# Patient Record
Sex: Female | Born: 1955 | Race: Black or African American | Hispanic: No | Marital: Single | Smoking: Never smoker
Health system: Southern US, Community
[De-identification: ages and names within clinical notes are randomized; demographics above are authoritative.]

## PROBLEM LIST (undated history)

## (undated) DIAGNOSIS — D649 Anemia, unspecified: Secondary | ICD-10-CM

## (undated) DIAGNOSIS — G43909 Migraine, unspecified, not intractable, without status migrainosus: Secondary | ICD-10-CM

## (undated) DIAGNOSIS — T7840XA Allergy, unspecified, initial encounter: Secondary | ICD-10-CM

## (undated) DIAGNOSIS — J45909 Unspecified asthma, uncomplicated: Secondary | ICD-10-CM

## (undated) HISTORY — PX: TONSILLECTOMY: SUR1361

## (undated) HISTORY — DX: Allergy, unspecified, initial encounter: T78.40XA

## (undated) HISTORY — DX: Migraine, unspecified, not intractable, without status migrainosus: G43.909

## (undated) HISTORY — DX: Anemia, unspecified: D64.9

## (undated) HISTORY — DX: Unspecified asthma, uncomplicated: J45.909

---

## 2013-08-13 ENCOUNTER — Encounter (HOSPITAL_COMMUNITY): Payer: Self-pay

## 2013-08-13 ENCOUNTER — Emergency Department (HOSPITAL_COMMUNITY)
Admission: EM | Admit: 2013-08-13 | Discharge: 2013-08-13 | Disposition: A | Discharge status: Home / Self Care | Payer: BC Managed Care – PPO | Source: Home / Self Care | Attending: Emergency Medicine | Admitting: Emergency Medicine

## 2013-08-13 DIAGNOSIS — M5416 Radiculopathy, lumbar region: Secondary | ICD-10-CM

## 2013-08-13 DIAGNOSIS — IMO0002 Reserved for concepts with insufficient information to code with codable children: Secondary | ICD-10-CM

## 2013-08-13 MED ORDER — MELOXICAM 7.5 MG PO TABS: 7.5000 mg | ORAL_TABLET | Freq: Every day | ORAL | Status: DC

## 2013-08-13 NOTE — ED Notes (Signed)
Pain in both legs x past 6 months, right leg as been getting red and blotchy for no apparent reason; no knpwn injury ; NAD

## 2013-08-13 NOTE — ED Provider Notes (Signed)
Medical screening examination/treatment/procedure(s) were performed by a resident physician and as supervising physician I was immediately available for consultation/collaboration.  Leslee Home, M.D.  Reuben Likes, MD 08/13/13 2217

## 2013-08-13 NOTE — ED Provider Notes (Signed)
  CSN: [id number]     Arrival date & time 08/13/13  1612 History     First MD Initiated Contact with Patient 08/13/13 1733     Chief Complaint  Patient presents with  . Leg Pain   (Consider location/radiation/quality/duration/timing/severity/associated sxs/prior Treatment) HPI Patient is a 57 yo F with 1 year history of progressively worsening right hip and leg pain. She states she is unable to sleep at night because of the pain. No known injury. She states nothing has changed that caused her to be evaluated today for her longstanding pain. She endorses some swelling of the leg. She states she is able to ambulate with no difficulty. She has taken Aleve which helps some, but does not take the pain away. She does not have a PCP and has not been evaluated for this pain in the past.   History reviewed. No pertinent past medical history. History reviewed. No pertinent past surgical history. History reviewed. No pertinent family history. History  Substance Use Topics  . Smoking status: Never Smoker   . Smokeless tobacco: Not on file  . Alcohol Use: No   OB History   Grav Para Term Preterm Abortions TAB SAB Ect Mult Living                 Review of Systems  Constitutional: Positive for appetite change. Negative for fever and chills.  Respiratory: Negative for cough and shortness of breath.   Cardiovascular: Positive for leg swelling. Negative for chest pain and palpitations.  Gastrointestinal: Negative for abdominal pain.  Genitourinary: Negative for dysuria.  Musculoskeletal: Positive for myalgias, arthralgias and gait problem. Negative for back pain and joint swelling.  Skin: Negative for rash.  Neurological: Negative for headaches.    Allergies  Review of patient's allergies indicates not on file.  Home Medications   Current Outpatient Rx  Name  Route  Sig  Dispense  Refill  . meloxicam (MOBIC) 7.5 MG tablet   Oral   Take 1 tablet (7.5 mg total) by mouth daily.   30  tablet   0    BP 131/78  Pulse 93  Temp(Src) 98.3 F (36.8 C) (Oral)  Resp 16  SpO2 100% Physical Exam  Constitutional: She appears well-developed and well-nourished. No distress.  HENT:  Head: Normocephalic and atraumatic.  Mouth/Throat: Oropharynx is clear and moist.  Neck: Normal range of motion.  Cardiovascular: Normal rate and regular rhythm.   Pulmonary/Chest: Effort normal and breath sounds normal.  Musculoskeletal:  No spinal tenderness. TTP of right greater trochanter and ?hyperesthesia of right thigh. No knee tenderness. FROM of right hip and knee. No pain with internal/external rotation of the leg. Mild discomfort with strength testing. Negative straight leg raise.   Lymphadenopathy:    She has no cervical adenopathy.    ED Course   Procedures (including critical care time)  Labs Reviewed - No data to display No results found. 1. Lumbar radicular pain    MDM  57 yo F with chronic hip/leg pain without injury or acute exacerbation. No red flags or concerning findings on physical exam.  -No indication for imaging today. -Given Mobic to take daily for inflammation and pain. -[facility] to establish a PCP. She needs a primary doctor -Follow up in 2 weeks if pain has not improved, or sooner if it worsens.  Hilarie Fredrickson, MD 08/13/13 [phone]

## 2013-09-02 ENCOUNTER — Encounter: Payer: Self-pay | Admitting: Family Medicine

## 2013-09-02 ENCOUNTER — Ambulatory Visit (INDEPENDENT_AMBULATORY_CARE_PROVIDER_SITE_OTHER): Payer: BC Managed Care – PPO | Admitting: Family Medicine

## 2013-09-02 VITALS — BP 104/70 | Temp 98.4°F | Ht 61.0 in | Wt 150.0 lb

## 2013-09-02 DIAGNOSIS — J45909 Unspecified asthma, uncomplicated: Secondary | ICD-10-CM

## 2013-09-02 DIAGNOSIS — Z7689 Persons encountering health services in other specified circumstances: Secondary | ICD-10-CM | POA: Insufficient documentation

## 2013-09-02 DIAGNOSIS — Z23 Encounter for immunization: Secondary | ICD-10-CM

## 2013-09-02 DIAGNOSIS — Z7189 Other specified counseling: Secondary | ICD-10-CM

## 2013-09-02 DIAGNOSIS — M549 Dorsalgia, unspecified: Secondary | ICD-10-CM | POA: Insufficient documentation

## 2013-09-02 DIAGNOSIS — J309 Allergic rhinitis, unspecified: Secondary | ICD-10-CM

## 2013-09-02 DIAGNOSIS — J302 Other seasonal allergic rhinitis: Secondary | ICD-10-CM

## 2013-09-02 MED ORDER — CYCLOBENZAPRINE HCL 5 MG PO TABS
5.0000 mg | ORAL_TABLET | Freq: Three times a day (TID) | ORAL | Status: DC | PRN
Start: 2013-09-02 — End: 2024-06-16

## 2013-09-02 NOTE — Progress Notes (Signed)
Chief Complaint  Patient presents with  . Establish Care    HPI:  Danielle Castillo is here to establish care. Has not seen a doctor in a long time and that doctor left. Last PCP and physical: has not had a physical in 10 years.  Has the following chronic problems and concerns today:  1)R leg pain: -started several years ago -comes and goes -has had a flare in last few weeks to a month or two -pain is located, R lower back lateral hip and upper leg and and buttock -worse with lying on it, better with nothing -denies: fevers, malaise, numbness, weakness, bowel or bladder incontinence -seen in urgent car in the past with flares and took meloxicam  2)Asthma/Alllergies: -followed by [place] asthma and allergy  Patient Active Problem List   Diagnosis Date Noted  . Back pain 09/02/2013  . Encounter to establish care 09/02/2013  . Asthma 09/02/2013  . Seasonal allergies 09/02/2013   Health Maintenance: -reports had colonoscopy and was normal in 2009, and had mammogram about 1 month ago and normal -adamantly refuses the flu vaccine, agrees to tdap  ROS: See pertinent positives and negatives per HPI.  Past Medical History  Diagnosis Date  . Allergy   . Asthma   . Migraine   . Anemia     chronic her whole life, had egd and colonoscopy in 2009 normal    Family History  Problem Relation Age of Onset  . Diabetes Mother   . Hypertension Mother   . Hypertension Father   . Stroke Father   . Cancer Sister     History   Social History  . Marital Status: Single    Spouse Name: N/A    Number of Children: N/A  . Years of Education: N/A   Social History Main Topics  . Smoking status: Never Smoker   . Smokeless tobacco: None  . Alcohol Use: No  . Drug Use: No  . Sexual Activity: None   Other Topics Concern  . None   Social History Narrative   Work or School: Armed forces operational officer, medication delivery      Home Situation: lives alone      Spiritual Beliefs:  Baptist      Lifestyle: walk occasionally; diet is good             Current outpatient prescriptions:albuterol (PROVENTIL HFA;VENTOLIN HFA) 108 (90 BASE) MCG/ACT inhaler, Inhale 2 puffs into the lungs every 6 (six) hours as needed for wheezing., Disp: , Rfl: ;  beclomethasone (QVAR) 80 MCG/ACT inhaler, Inhale 1 puff into the lungs as needed., Disp: , Rfl: ;  NON FORMULARY, Allergy shots, Disp: , Rfl:  cyclobenzaprine (FLEXERIL) 5 MG tablet, Take 1 tablet (5 mg total) by mouth 3 (three) times daily as needed for muscle spasms., Disp: 20 tablet, Rfl: 0  EXAM:  Filed Vitals:   09/02/13 1425  BP: 104/70  Temp: 98.4 F (36.9 C)    Body mass index is 28.36 kg/(m^2).  GENERAL: vitals reviewed and listed above, alert, oriented, appears well hydrated and in no acute distress  HEENT: atraumatic, conjunttiva clear, no obvious abnormalities on inspection of external nose and ears  NECK: no obvious masses on inspection  LUNGS: clear to auscultation bilaterally, no wheezes, rales or rhonchi, good air movement  CV: HRRR, no peripheral edema  MS: moves all extremities without noticeable abnormality Normal Gait Normal inspection of back, no obvious scoliosis or leg length descrepancy No bony TTP Soft tissue TTP at:  R lumbar paraspinal muscles, some TTP R and L glutes, R IT band -/+ tests: neg trendelenburg,+facet loading on R, -SLRT, -CLRT, +FABER on R, -FADIR, pain in hip with R ext rotation of hip Normal muscle strength, sensation to light touch and DTRs in LEs bilaterally  PSYCH: pleasant and cooperative, no obvious depression or anxiety  ASSESSMENT AND PLAN:  Discussed the following assessment and plan:  Back pain - Plan: cyclobenzaprine (FLEXERIL) 5 MG tablet, DG Lumbar Spine Complete, DG Hip Complete Right  Encounter to establish care  Asthma  Seasonal allergies -We reviewed the PMH, PSH, FH, SH, Meds and Allergies. -We provided refills for any medications we will prescribe  as needed. -We addressed current concerns per orders and patient instructions. -We have asked for records for pertinent exams, studies, vaccines and notes from previous providers. -We have advised patient to follow up per instructions below. -tdap today -return for physical with pap and labs -getting plain films for chronic leg/back pain and conservative tx, no neuro deficits on exam  -Patient advised to return or notify a doctor immediately if symptoms worsen or persist or new concerns arise.  Patient Instructions  -PLEASE SIGN UP FOR MYCHART TODAY   We recommend the following healthy lifestyle measures: - eat a healthy diet consisting of lots of vegetables, fruits, beans, nuts, seeds, healthy meats such as white chicken and fish and whole grains.  - avoid fried foods, fast food, processed foods, sodas, red meet and other fattening foods.  - get a least 150 minutes of aerobic exercise per week.   For back pain: -get xrays -heat for 15 minutes twice dialy -flexeril for next 3 days per instructions -tylenol 500-1000mg  up to 3 times daily or ibuprofen 400-600mg  up to twice dialy as needed -follow up in 4 weeks with sports medicine doctor if not improving or sooner if other concerns   Follow up in: with Dr. Katrinka Blazing in sports medicine at [facility] clinic in 4 weeks if leg/back symptoms persist Follow up with me for physical exam in late november when I return from maternity leave      Kriste Basque R.

## 2013-09-02 NOTE — Addendum Note (Signed)
Addended by: Kern Reap B on: 09/02/2013 05:41 PM   Modules accepted: Orders

## 2013-09-02 NOTE — Patient Instructions (Signed)
-  PLEASE SIGN UP FOR MYCHART TODAY   We recommend the following healthy lifestyle measures: - eat a healthy diet consisting of lots of vegetables, fruits, beans, nuts, seeds, healthy meats such as white chicken and fish and whole grains.  - avoid fried foods, fast food, processed foods, sodas, red meet and other fattening foods.  - get a least 150 minutes of aerobic exercise per week.   For back pain: -get xrays -heat for 15 minutes twice dialy -flexeril for next 3 days per instructions -tylenol 500-1000mg  up to 3 times daily or ibuprofen 400-600mg  up to twice dialy as needed -follow up in 4 weeks with sports medicine doctor if not improving or sooner if other concerns   Follow up in: with Dr. Katrinka Blazing in sports medicine at [facility] clinic in 4 weeks if leg/back symptoms persist Follow up with me for physical exam in late november when I return from maternity leave

## 2013-09-03 ENCOUNTER — Ambulatory Visit (INDEPENDENT_AMBULATORY_CARE_PROVIDER_SITE_OTHER)
Admission: RE | Admit: 2013-09-03 | Discharge: 2013-09-03 | Disposition: A | Discharge status: Home / Self Care | Payer: BC Managed Care – PPO | Source: Ambulatory Visit | Attending: Family Medicine | Admitting: Family Medicine

## 2013-09-03 DIAGNOSIS — M549 Dorsalgia, unspecified: Secondary | ICD-10-CM

## 2013-09-07 NOTE — Progress Notes (Signed)
Quick Note:  Called and spoke with pt and pt is aware. ______ 

## 2013-09-10 ENCOUNTER — Encounter: Payer: Self-pay | Admitting: Family Medicine

## 2013-09-10 ENCOUNTER — Ambulatory Visit (INDEPENDENT_AMBULATORY_CARE_PROVIDER_SITE_OTHER): Payer: BC Managed Care – PPO | Admitting: Family Medicine

## 2013-09-10 VITALS — BP 120/82 | Temp 98.4°F | Wt 152.0 lb

## 2013-09-10 DIAGNOSIS — IMO0002 Reserved for concepts with insufficient information to code with codable children: Secondary | ICD-10-CM

## 2013-09-10 DIAGNOSIS — M5410 Radiculopathy, site unspecified: Secondary | ICD-10-CM

## 2013-09-10 DIAGNOSIS — G8929 Other chronic pain: Secondary | ICD-10-CM

## 2013-09-10 DIAGNOSIS — M549 Dorsalgia, unspecified: Secondary | ICD-10-CM

## 2013-09-10 MED ORDER — NAPROXEN 500 MG PO TABS: 500.0000 mg | ORAL_TABLET | Freq: Two times a day (BID) | ORAL | Status: DC | PRN

## 2013-09-10 NOTE — Progress Notes (Signed)
Chief Complaint  Patient presents with  . Back Pain    HPI:  Chronic Low back pain: -on and off for over 1 year, recent flare in last month -seen as new pt recently for this with some mild R sided radicular symptoms, no sig neuro deficits on exam  or alarms signs or symptoms -plain films with lumbar DDD as suspected -pain is sharp in R low back radiating to R buttock and R lat leg -worse with certain movements, better with flexeril, tylenol and ibuprofen -she was to do heat, HEP, flexeril and NSAIDs/Tylenol and follow up in 4 weeks with sports med if persisted -she has been doing the exercise; has tried the tylenol ibuprofen and tylenol  -symptoms are the same - no better, no worse -denies: fevers, weakness, numbness, bowel or bladder incontinence -wants referral to back doctor  ROS: See pertinent positives and negatives per HPI.  Past Medical History  Diagnosis Date  . Allergy   . Asthma   . Migraine   . Anemia     chronic her whole life, had egd and colonoscopy in 2009 normal    Past Surgical History  Procedure Laterality Date  . Cesarean section      Family History  Problem Relation Age of Onset  . Diabetes Mother   . Hypertension Mother   . Hypertension Father   . Stroke Father   . Cancer Sister     History   Social History  . Marital Status: Single    Spouse Name: N/A    Number of Children: N/A  . Years of Education: N/A   Social History Main Topics  . Smoking status: Never Smoker   . Smokeless tobacco: None  . Alcohol Use: No  . Drug Use: No  . Sexual Activity: None   Other Topics Concern  . None   Social History Narrative   Work or School: Armed forces operational officer, medication delivery      Home Situation: lives alone      Spiritual Beliefs: Baptist      Lifestyle: walk occasionally; diet is good             Current outpatient prescriptions:albuterol (PROVENTIL HFA;VENTOLIN HFA) 108 (90 BASE) MCG/ACT inhaler, Inhale 2 puffs into the  lungs every 6 (six) hours as needed for wheezing., Disp: , Rfl: ;  beclomethasone (QVAR) 80 MCG/ACT inhaler, Inhale 1 puff into the lungs as needed., Disp: , Rfl: ;  cyclobenzaprine (FLEXERIL) 5 MG tablet, Take 1 tablet (5 mg total) by mouth 3 (three) times daily as needed for muscle spasms., Disp: 20 tablet, Rfl: 0 NON FORMULARY, Allergy shots, Disp: , Rfl: ;  naproxen (NAPROSYN) 500 MG tablet, Take 1 tablet (500 mg total) by mouth 2 (two) times daily as needed., Disp: 30 tablet, Rfl: 0  EXAM:  Filed Vitals:   09/10/13 1301  BP: 120/82  Temp: 98.4 F (36.9 C)    Body mass index is 28.74 kg/(m^2).  GENERAL: vitals reviewed and listed above, alert, oriented, appears well hydrated and in no acute distress  HEENT: atraumatic, conjunttiva clear, no obvious abnormalities on inspection of external nose and ears  NECK: no obvious masses on inspection  LUNGS: clear to auscultation bilaterally, no wheezes, rales or rhonchi, good air movement  CV: HRRR, no peripheral edema  MS: moves all extremities without noticeable abnormality Normal Gait Normal inspection of back, no obvious scoliosis or leg length descrepancy No bony TTP Soft tissue TTP at: R lumbar paraspinal muscles, R  glutes -/+ tests: neg trendelenburg,+facet loading R, -SLRT, -CLRT,+-FABER R, -FADIR Normal muscle strength, sensation to light touch and DTRs in LEs bilaterally  PSYCH: pleasant and cooperative, no obvious depression or anxiety  ASSESSMENT AND PLAN:  Discussed the following assessment and plan:  Chronic back pain - Plan: Ambulatory referral to Physical Medicine Rehab, naproxen (NAPROSYN) 500 MG tablet -symptoms unchanged and pt requesting referral to specialist; suspect DDD with possible mild radiculopathy - no alarm features or neuro deficits on exam, referral placed and will change to naproxen with recent data to suggest this safer then other nsaids  Radiculopathy - Plan: Ambulatory referral to Physical  Medicine Rehab, naproxen (NAPROSYN) 500 MG tablet  -Patient advised to return or notify a doctor immediately if symptoms worsen or persist or new concerns arise.  Patient Instructions  -continue back exercises and heat  -trial of naproxen for pain (do not use with other medications for pain)  -We placed a referral for you as discussed. It usually takes about 1-2 weeks to process and schedule this referral. If you have not heard from [place] regarding this appointment in 2 weeks please contact our office.  -follow up in late November or early December for physical with pap      Danielle Castillo, Dahlia Client R.

## 2013-09-10 NOTE — Patient Instructions (Signed)
-  continue back exercises and heat  -trial of naproxen for pain (do not use with other medications for pain)  -We placed a referral for you as discussed. It usually takes about 1-2 weeks to process and schedule this referral. If you have not heard from [place] regarding this appointment in 2 weeks please contact our office.  -follow up in late November or early December for physical with pap

## 2013-09-23 ENCOUNTER — Encounter: Payer: Self-pay | Admitting: Physical Medicine & Rehabilitation

## 2013-10-26 ENCOUNTER — Ambulatory Visit: Payer: BC Managed Care – PPO | Admitting: Physical Medicine & Rehabilitation

## 2013-11-26 ENCOUNTER — Ambulatory Visit (INDEPENDENT_AMBULATORY_CARE_PROVIDER_SITE_OTHER): Payer: BC Managed Care – PPO | Admitting: Family Medicine

## 2013-11-26 ENCOUNTER — Other Ambulatory Visit (HOSPITAL_COMMUNITY)
Admission: RE | Admit: 2013-11-26 | Discharge: 2013-11-26 | Disposition: A | Discharge status: Home / Self Care | Payer: BC Managed Care – PPO | Source: Ambulatory Visit | Attending: Family Medicine | Admitting: Family Medicine

## 2013-11-26 ENCOUNTER — Encounter: Payer: Self-pay | Admitting: Family Medicine

## 2013-11-26 VITALS — BP 100/70 | Temp 98.8°F | Wt 153.0 lb

## 2013-11-26 DIAGNOSIS — Z01419 Encounter for gynecological examination (general) (routine) without abnormal findings: Secondary | ICD-10-CM | POA: Insufficient documentation

## 2013-11-26 DIAGNOSIS — Z1151 Encounter for screening for human papillomavirus (HPV): Secondary | ICD-10-CM | POA: Insufficient documentation

## 2013-11-26 DIAGNOSIS — Z0000 Encounter for general adult medical examination without abnormal findings: Secondary | ICD-10-CM

## 2013-11-26 LAB — LIPID PANEL
Cholesterol: 311 mg/dL — ABNORMAL HIGH (ref 0–200)
Total CHOL/HDL Ratio: 6
Triglycerides: 189 mg/dL — ABNORMAL HIGH (ref 0.0–149.0)
VLDL: 37.8 mg/dL (ref 0.0–40.0)

## 2013-11-26 NOTE — Addendum Note (Signed)
Addended by: Azucena Freed on: 11/26/2013 10:59 AM   Modules accepted: Orders

## 2013-11-26 NOTE — Progress Notes (Signed)
Chief Complaint  Patient presents with  . Follow-up    HPI:  Here for CPE:  -Concerns today: none  -Diet: variety of foods, balance and well rounded  -Taking vit D and calcium: no  -Exercise: walking every diet  -Diabetes and Dyslipidemia Screening: getting today  -Hx of HTN: no  -Vaccines: UTD  -pap history: has been some time  -FDLMP:postmenopausal, no bleeding  -sexual activity: no new partners  -wants STI testing: no  -FH breast, colon or ovarian ca: see FH -had mammogram -had colonoscopy in 2009  -Alcohol, Tobacco, drug use: see social history  Review of Systems - .Review of Systems  Constitutional: Negative for fever, chills and malaise/fatigue.  HENT: Negative for hearing loss.   Eyes: Negative for double vision.  Respiratory: Negative for shortness of breath.   Cardiovascular: Negative for chest pain and palpitations.  Gastrointestinal: Negative for vomiting, diarrhea and blood in stool.  Genitourinary: Negative for dysuria.  Musculoskeletal: Negative for falls.  Skin: Negative for rash.  Neurological: Negative for seizures and loss of consciousness.  Endo/Heme/Allergies: Does not bruise/bleed easily.  Psychiatric/Behavioral: Negative for depression and suicidal ideas.     Past Medical History  Diagnosis Date  . Allergy   . Asthma   . Migraine   . Anemia     chronic her whole life, had egd and colonoscopy in 2009 normal    Past Surgical History  Procedure Laterality Date  . Cesarean section      Family History  Problem Relation Age of Onset  . Diabetes Mother   . Hypertension Mother   . Hypertension Father   . Stroke Father   . Cancer Sister     History   Social History  . Marital Status: Single    Spouse Name: N/A    Number of Children: N/A  . Years of Education: N/A   Social History Main Topics  . Smoking status: Never Smoker   . Smokeless tobacco: None  . Alcohol Use: No  . Drug Use: No  . Sexual Activity: None    Other Topics Concern  . None   Social History Narrative   Work or School: Armed forces operational officer, medication delivery      Home Situation: lives alone      Spiritual Beliefs: Baptist      Lifestyle: walk occasionally; diet is good             Current outpatient prescriptions:albuterol (PROVENTIL HFA;VENTOLIN HFA) 108 (90 BASE) MCG/ACT inhaler, Inhale 2 puffs into the lungs every 6 (six) hours as needed for wheezing., Disp: , Rfl: ;  beclomethasone (QVAR) 80 MCG/ACT inhaler, Inhale 1 puff into the lungs as needed., Disp: , Rfl: ;  cyclobenzaprine (FLEXERIL) 5 MG tablet, Take 1 tablet (5 mg total) by mouth 3 (three) times daily as needed for muscle spasms., Disp: 20 tablet, Rfl: 0 naproxen (NAPROSYN) 500 MG tablet, Take 1 tablet (500 mg total) by mouth 2 (two) times daily as needed., Disp: 30 tablet, Rfl: 0;  NON FORMULARY, Allergy shots, Disp: , Rfl:   EXAM:  Filed Vitals:   11/26/13 1010  BP: 100/70  Temp: 98.8 F (37.1 C)    GENERAL: vitals reviewed and listed below, alert, oriented, appears well hydrated and in no acute distress  HEENT: head atraumatic, PERRLA, normal appearance of eyes, ears, nose and mouth. moist mucus membranes.  NECK: supple, no masses or lymphadenopathy  LUNGS: clear to auscultation bilaterally, no rales, rhonchi or wheeze  CV: HRRR, no  peripheral edema or cyanosis, normal pedal pulses  BREAST: normal appearance - no lesions or discharge, on palpation normal breast tissue without any suspicious masses  ABDOMEN: bowel sounds normal, soft, non tender to palpation, no masses, no rebound or guarding  GU: normal appearance of external genitalia - no lesions or masses, normal vaginal mucosa - no abnormal discharge, normal appearance of cervix - no lesions or abnormal discharge, no masses or tenderness on palpation of uterus and ovaries.  RECTAL: refused  SKIN: no rash or abnormal lesions  MS: normal gait, moves all extremities  normally  NEURO: CN II-XII grossly intact, normal muscle strength and sensation to light touch on extremities  PSYCH: normal affect, pleasant and cooperative  ASSESSMENT AND PLAN:  Discussed the following assessment and plan:  Visit for preventive health examination - Plan: Lipid Panel, Hemoglobin A1c  -Discussed and advised all [place] preventive services health task force level A and B recommendations for age, sex and risks.  -Advised at least 150 minutes of exercise per week and a healthy diet low in saturated fats and sweets and consisting of fresh fruits and vegetables, lean meats such as fish and white chicken and whole grains.  -labs, studies and vaccines per orders this encounter  Orders Placed This Encounter  Procedures  . Lipid Panel  . Hemoglobin A1c    Patient Instructions  -We have ordered labs or studies at this visit. It can take up to 1-2 weeks for results and processing. We will contact you with instructions IF your results are abnormal. Normal results will be released to your [facility]. If you have not heard from [place] or can not find your results in [facility] in 2 weeks please contact our office.           Patient advised to return to clinic immediately if symptoms worsen or persist or new concerns.  @[email or web address] @  No Follow-up on file.  Danielle Basque R.

## 2013-11-26 NOTE — Progress Notes (Signed)
Pre visit review using our clinic review tool, if applicable. No additional management support is needed unless otherwise documented below in the visit note. 

## 2013-11-26 NOTE — Patient Instructions (Signed)
-  We have ordered labs or studies at this visit. It can take up to 1-2 weeks for results and processing. We will contact you with instructions IF your results are abnormal. Normal results will be released to your MYCHART. If you have not heard from us or can not find your results in MYCHART in 2 weeks please contact our office.        

## 2013-11-30 ENCOUNTER — Ambulatory Visit: Payer: BC Managed Care – PPO | Admitting: Physical Medicine & Rehabilitation

## 2013-11-30 ENCOUNTER — Encounter: Payer: BC Managed Care – PPO | Attending: Physical Medicine & Rehabilitation

## 2014-01-14 ENCOUNTER — Encounter: Payer: BC Managed Care – PPO | Attending: Physical Medicine & Rehabilitation

## 2014-01-14 ENCOUNTER — Encounter: Payer: Self-pay | Admitting: Physical Medicine & Rehabilitation

## 2014-01-14 ENCOUNTER — Ambulatory Visit (HOSPITAL_BASED_OUTPATIENT_CLINIC_OR_DEPARTMENT_OTHER): Payer: BC Managed Care – PPO | Admitting: Physical Medicine & Rehabilitation

## 2014-01-14 VITALS — BP 127/95 | HR 102 | Resp 14 | Ht 62.0 in | Wt 147.0 lb

## 2014-01-14 DIAGNOSIS — M549 Dorsalgia, unspecified: Secondary | ICD-10-CM

## 2014-01-14 DIAGNOSIS — G5710 Meralgia paresthetica, unspecified lower limb: Secondary | ICD-10-CM

## 2014-01-14 DIAGNOSIS — M47817 Spondylosis without myelopathy or radiculopathy, lumbosacral region: Secondary | ICD-10-CM

## 2014-01-14 DIAGNOSIS — G8929 Other chronic pain: Secondary | ICD-10-CM

## 2014-01-14 DIAGNOSIS — Z79899 Other long term (current) drug therapy: Secondary | ICD-10-CM

## 2014-01-14 DIAGNOSIS — M545 Low back pain, unspecified: Secondary | ICD-10-CM | POA: Insufficient documentation

## 2014-01-14 DIAGNOSIS — Z5181 Encounter for therapeutic drug level monitoring: Secondary | ICD-10-CM

## 2014-01-14 DIAGNOSIS — M79609 Pain in unspecified limb: Secondary | ICD-10-CM | POA: Insufficient documentation

## 2014-01-14 DIAGNOSIS — G5720 Lesion of femoral nerve, unspecified lower limb: Secondary | ICD-10-CM | POA: Insufficient documentation

## 2014-01-14 DIAGNOSIS — M51379 Other intervertebral disc degeneration, lumbosacral region without mention of lumbar back pain or lower extremity pain: Secondary | ICD-10-CM

## 2014-01-14 DIAGNOSIS — G5711 Meralgia paresthetica, right lower limb: Secondary | ICD-10-CM | POA: Insufficient documentation

## 2014-01-14 DIAGNOSIS — M5137 Other intervertebral disc degeneration, lumbosacral region: Secondary | ICD-10-CM | POA: Insufficient documentation

## 2014-01-14 MED ORDER — NAPROXEN 500 MG PO TABS: 500.0000 mg | ORAL_TABLET | Freq: Two times a day (BID) | ORAL | Status: DC

## 2014-01-14 MED ORDER — GABAPENTIN 100 MG PO CAPS: 100.0000 mg | ORAL_CAPSULE | Freq: Three times a day (TID) | ORAL | Status: DC

## 2014-01-14 NOTE — Progress Notes (Signed)
Subjective:    Patient ID: Danielle Castillo, female    DOB: 09/12/1956, 58 y.o.   MRN: [id number]  HPI Please see smart set  Pain Inventory Average Pain 10 Pain Right Now 10 My pain is sharp, burning, tingling and aching  In the last 24 hours, has pain interfered with the following? General activity n/a Relation with others n/a Enjoyment of life n/a What TIME of day is your pain at its worst? night Sleep (in general) Poor  Pain is worse with: walking, bending, sitting and standing Pain improves with: rest Relief from Meds: 5  Mobility walk without assistance ability to climb steps?  yes do you drive?  yes Do you have any goals in this area?  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  no  Neuro/Psych No problems in this area  Prior Studies x-rays  Physicians involved in your care Primary care .   Family History  Problem Relation Age of Onset  . Diabetes Mother   . Hypertension Mother   . Hypertension Father   . Stroke Father   . Cancer Sister    History   Social History  . Marital Status: Single    Spouse Name: N/A    Number of Children: N/A  . Years of Education: N/A   Social History Main Topics  . Smoking status: Never Smoker   . Smokeless tobacco: None  . Alcohol Use: No  . Drug Use: No  . Sexual Activity: None   Other Topics Concern  . 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 Cesarean section     Past Medical History  Diagnosis Date  . Allergy   . Asthma   . Migraine   . Anemia     chronic her whole life, had egd and colonoscopy in 2009 normal   BP 127/95  Pulse 102  Resp 14  Ht 5\' 2"  (1.575 m)  Wt 147 lb (66.679 kg)  BMI 26.88 kg/m2   SpO2 97%     Review of Systems  Constitutional: Positive for diaphoresis.  Respiratory: Positive for cough, chest tightness and wheezing.   Cardiovascular: Positive for leg swelling.  Gastrointestinal: Positive for abdominal pain.  Musculoskeletal: Positive for back pain.  All other systems reviewed and are negative.       Objective:   Physical Exam        Assessment & Plan:   Subjective:    Danielle Castillo is a 58 y.o. female who presents for evaluation of low back pain. The patient has had recurrent self limited episodes of low back pain in the past and Back pain and right thigh pain. Symptoms have been present for 5 years and are gradually worsening.  Onset was related to / precipitated by no known injury. The pain is located in the right lumbar area or radiating to right leg(s) and R lateral thigh. The pain is described as aching, burning, pinching and stabbing and occurs all day and Also awakens patient from sleep. See pain inventory Symptoms are exacerbated by lying down, sitting and standing. Symptoms are improved by heat and muscle  relaxants. She has also tried acetaminophen, change in body position, muscle relaxants and NSAIDs which provided no symptom relief. She has tingling in the right leg associated with the back pain. The patient has no "red flag" history indicative of complicated back pain.  The following portions of the patient's history were reviewed and updated as appropriate: allergies, current medications, past family history, past medical history, past social history, past surgical history and problem list.  Review of Systems  see other note    Objective:   Full range of motion without pain, no tenderness, no spasm, no curvature. Normal reflexes, gait, strength and negative straight-leg raise. Inspection and palpation: inspection of back is normal, no tenderness noted. Muscle tone and ROM exam: muscle tone normal without spasm. Neurological: normal DTRs,  muscle strength and reflexes, right knee reflex 1+.    Assessment:    Chronic low back pain with right thigh pain but no corresponding physical exam findings of radiculopathy   2. Right lateral femoral cutaneous neuropathy, trial of gabapentin discussed potential drowsiness Plan:    Natural history and expected course discussed. Questions answered. Proper lifting, bending technique discussed. Regular aerobic and trunk strengthening exercises discussed. Heat to affected area as needed for local pain relief. NSAIDs per medication orders. PT referral. Follow-up in 1 month.

## 2014-01-14 NOTE — Patient Instructions (Signed)
Back Pain, Adult Low back pain is very common. About 1 in 5 people have back pain.The cause of low back pain is rarely dangerous. The pain often gets better over time.About half of people with a sudden onset of back pain feel better in just 2 weeks. About 8 in 10 people feel better by 6 weeks.  CAUSES Some common causes of back pain include:  Strain of the muscles or ligaments supporting the spine.  Wear and tear (degeneration) of the spinal discs.  Arthritis.  Direct injury to the back. DIAGNOSIS Most of the time, the direct cause of low back pain is not known.However, back pain can be treated effectively even when the exact cause of the pain is unknown.Answering your caregiver's questions about your overall health and symptoms is one of the most accurate ways to make sure the cause of your pain is not dangerous. If your caregiver needs more information, he or she may order lab work or imaging tests (X-rays or MRIs).However, even if imaging tests show changes in your back, this usually does not require surgery. HOME CARE INSTRUCTIONS For many people, back pain returns.Since low back pain is rarely dangerous, it is often a condition that people can learn to manageon their own.   Remain active. It is stressful on the back to sit or stand in one place. Do not sit, drive, or stand in one place for more than 30 minutes at a time. Take short walks on level surfaces as soon as pain allows.Try to increase the length of time you walk each day.  Do not stay in bed.Resting more than 1 or 2 days can delay your recovery.  Do not avoid exercise or work.Your body is made to move.It is not dangerous to be active, even though your back may hurt.Your back will likely heal faster if you return to being active before your pain is gone.  Pay attention to your body when you bend and lift. Many people have less discomfortwhen lifting if they bend their knees, keep the load close to their bodies,and  avoid twisting. Often, the most comfortable positions are those that put less stress on your recovering back.  Find a comfortable position to sleep. Use a firm mattress and lie on your side with your knees slightly bent. If you lie on your back, put a pillow under your knees.  Only take over-the-counter or prescription medicines as directed by your caregiver. Over-the-counter medicines to reduce pain and inflammation are often the most helpful.Your caregiver may prescribe muscle relaxant drugs.These medicines help dull your pain so you can more quickly return to your normal activities and healthy exercise.  Put ice on the injured area.  Put ice in a plastic bag.  Place a towel between your skin and the bag.  Leave the ice on for 15-20 minutes, 03-04 times a day for the first 2 to 3 days. After that, ice and heat may be alternated to reduce pain and spasms.  Ask your caregiver about trying back exercises and gentle massage. This may be of some benefit.  Avoid feeling anxious or stressed.Stress increases muscle tension and can worsen back pain.It is important to recognize when you are anxious or stressed and learn ways to manage it.Exercise is a great option. SEEK MEDICAL CARE IF:  You have pain that is not relieved with rest or medicine.  You have pain that does not improve in 1 week.  You have new symptoms.  You are generally not feeling well. SEEK   IMMEDIATE MEDICAL CARE IF:   You have pain that radiates from your back into your legs.  You develop new bowel or bladder control problems.  You have unusual weakness or numbness in your arms or legs.  You develop nausea or vomiting.  You develop abdominal pain.  You feel faint. Document Released: 12/09/2005 Document Revised: 06/09/2012 Document Reviewed: 04/29/2011 Exit[employer] Patient Information 2014 [employer].  

## 2014-02-11 ENCOUNTER — Ambulatory Visit: Payer: BC Managed Care – PPO | Admitting: Physical Medicine & Rehabilitation

## 2014-02-25 ENCOUNTER — Ambulatory Visit: Payer: 59 | Admitting: Physical Therapy

## 2014-09-26 ENCOUNTER — Encounter (HOSPITAL_BASED_OUTPATIENT_CLINIC_OR_DEPARTMENT_OTHER): Payer: Self-pay | Admitting: Emergency Medicine

## 2014-09-26 ENCOUNTER — Emergency Department (HOSPITAL_BASED_OUTPATIENT_CLINIC_OR_DEPARTMENT_OTHER)
Admission: EM | Admit: 2014-09-26 | Discharge: 2014-09-26 | Disposition: A | Discharge status: Home / Self Care | Payer: 59 | Attending: Emergency Medicine | Admitting: Emergency Medicine

## 2014-09-26 DIAGNOSIS — Z79899 Other long term (current) drug therapy: Secondary | ICD-10-CM | POA: Insufficient documentation

## 2014-09-26 DIAGNOSIS — G43909 Migraine, unspecified, not intractable, without status migrainosus: Secondary | ICD-10-CM | POA: Insufficient documentation

## 2014-09-26 DIAGNOSIS — Z862 Personal history of diseases of the blood and blood-forming organs and certain disorders involving the immune mechanism: Secondary | ICD-10-CM | POA: Insufficient documentation

## 2014-09-26 DIAGNOSIS — Z792 Long term (current) use of antibiotics: Secondary | ICD-10-CM | POA: Diagnosis not present

## 2014-09-26 DIAGNOSIS — J45909 Unspecified asthma, uncomplicated: Secondary | ICD-10-CM | POA: Diagnosis not present

## 2014-09-26 DIAGNOSIS — Z791 Long term (current) use of non-steroidal anti-inflammatories (NSAID): Secondary | ICD-10-CM | POA: Insufficient documentation

## 2014-09-26 DIAGNOSIS — H109 Unspecified conjunctivitis: Secondary | ICD-10-CM | POA: Insufficient documentation

## 2014-09-26 DIAGNOSIS — H5711 Ocular pain, right eye: Secondary | ICD-10-CM | POA: Diagnosis present

## 2014-09-26 DIAGNOSIS — Z7951 Long term (current) use of inhaled steroids: Secondary | ICD-10-CM | POA: Diagnosis not present

## 2014-09-26 MED ORDER — TOBRAMYCIN 0.3 % OP SOLN: 2.0000 [drp] | OPHTHALMIC | Status: DC

## 2014-09-26 NOTE — ED Provider Notes (Signed)
CSN: [id number]     Arrival date & time 09/26/14  1339 History   First MD Initiated Contact with Patient 09/26/14 1611     Chief Complaint  Patient presents with  . Eye Problem     (Consider location/radiation/quality/duration/timing/severity/associated sxs/prior Treatment) Patient is a 58 y.o. female presenting with eye problem. The history is provided by the patient. No language interpreter was used.  Eye Problem Location:  R eye Quality:  Aching Severity:  Moderate Onset quality:  Gradual Timing:  Constant Progression:  Worsening Chronicity:  New Relieved by:  Nothing Worsened by:  Nothing tried Associated symptoms: crusting   Associated symptoms: no blurred vision     Past Medical History  Diagnosis Date  . Allergy   . Asthma   . Migraine   . Anemia     chronic her whole life, had egd and colonoscopy in 2009 normal   Past Surgical History  Procedure Laterality Date  . Cesarean section    . Tonsillectomy     Family History  Problem Relation Age of Onset  . Diabetes Mother   . Hypertension Mother   . Hypertension Father   . Stroke Father   . Cancer Sister    History  Substance Use Topics  . Smoking status: Never Smoker   . Smokeless tobacco: Not on file  . Alcohol Use: No   OB History   Grav Para Term Preterm Abortions TAB SAB Ect Mult Living   2 2             Review of Systems  Eyes: Negative for blurred vision.  All other systems reviewed and are negative.     Allergies  Asa  Home Medications   Prior to Admission medications   Medication Sig Start Date End Date Taking? Authorizing Provider  albuterol (PROVENTIL HFA;VENTOLIN HFA) 108 (90 BASE) MCG/ACT inhaler Inhale 2 puffs into the lungs every 6 (six) hours as needed for wheezing.    Historical Provider, MD  beclomethasone (QVAR) 80 MCG/ACT inhaler Inhale 1 puff into the lungs as needed.    Historical Provider, MD  cyclobenzaprine (FLEXERIL) 5 MG tablet Take 1 tablet (5 mg total) by mouth 3  (three) times daily as needed for muscle spasms. 09/02/13   Terressa Koyanagi, DO  gabapentin (NEURONTIN) 100 MG capsule Take 1 capsule (100 mg total) by mouth 3 (three) times daily. 01/14/14   Erick Colace, MD  naproxen (NAPROSYN) 500 MG tablet Take 1 tablet (500 mg total) by mouth 2 (two) times daily with a meal. 01/14/14   Erick Colace, MD  NON FORMULARY Allergy shots    Historical Provider, MD  tobramycin (TOBREX) 0.3 % ophthalmic solution Place 2 drops into the right eye every 4 (four) hours. 09/26/14   Elson Areas, PA-C   BP 121/76  Pulse 75  Temp(Src) 98.8 F (37.1 C) (Oral)  Resp 18  Ht 5\' 1"  (1.549 m)  Wt 150 lb (68.04 kg)  BMI 28.36 kg/m2  SpO2 97% Physical Exam  Constitutional: She is oriented to person, place, and time. She appears well-developed and well-nourished.  HENT:  Head: Normocephalic and atraumatic.  Eyes: Conjunctivae and EOM are normal. Pupils are equal, round, and reactive to light. Right eye exhibits discharge.  Neck: Normal range of motion.  Cardiovascular: Normal rate.   Pulmonary/Chest: Effort normal.  Musculoskeletal: Normal range of motion.  Neurological: She is alert and oriented to person, place, and time. She has normal reflexes.  Skin: Skin is  warm.  Psychiatric: She has a normal mood and affect.    ED Course  Procedures (including critical care time) Labs Review Labs Reviewed - No data to display  Imaging Review No results found.   EKG Interpretation None      MDM   Final diagnoses:  Conjunctivitis of right eye    tobrex opth    Elson AreasLeslie K Molly Savarino, PA-C 09/26/14 1633  Lonia SkinnerLeslie K Mays ChapelSofia, [place] JerseyPA-C 09/26/14 [phone]

## 2014-09-26 NOTE — ED Provider Notes (Signed)
History/physical exam/procedure(s) were performed by non-physician practitioner and as supervising physician I was immediately available for consultation/collaboration. I have reviewed all notes and am in agreement with care and plan.   Hilario Quarryanielle S Ray, MD 09/26/14 1640

## 2014-09-26 NOTE — ED Notes (Signed)
Pt c/o right eye redness and drainage x 3 days

## 2014-09-26 NOTE — Discharge Instructions (Signed)

## 2014-10-24 ENCOUNTER — Encounter (HOSPITAL_BASED_OUTPATIENT_CLINIC_OR_DEPARTMENT_OTHER): Payer: Self-pay | Admitting: Emergency Medicine

## 2015-08-22 ENCOUNTER — Emergency Department (HOSPITAL_BASED_OUTPATIENT_CLINIC_OR_DEPARTMENT_OTHER): Payer: Worker's Compensation

## 2015-08-22 ENCOUNTER — Encounter (HOSPITAL_BASED_OUTPATIENT_CLINIC_OR_DEPARTMENT_OTHER): Payer: Self-pay | Admitting: *Deleted

## 2015-08-22 ENCOUNTER — Emergency Department (HOSPITAL_BASED_OUTPATIENT_CLINIC_OR_DEPARTMENT_OTHER)
Admission: EM | Admit: 2015-08-22 | Discharge: 2015-08-22 | Disposition: A | Discharge status: Home / Self Care | Payer: Worker's Compensation | Attending: Emergency Medicine | Admitting: Emergency Medicine

## 2015-08-22 DIAGNOSIS — Y9289 Other specified places as the place of occurrence of the external cause: Secondary | ICD-10-CM | POA: Diagnosis not present

## 2015-08-22 DIAGNOSIS — S66911A Strain of unspecified muscle, fascia and tendon at wrist and hand level, right hand, initial encounter: Secondary | ICD-10-CM | POA: Diagnosis not present

## 2015-08-22 DIAGNOSIS — Y990 Civilian activity done for income or pay: Secondary | ICD-10-CM | POA: Insufficient documentation

## 2015-08-22 DIAGNOSIS — Y9389 Activity, other specified: Secondary | ICD-10-CM | POA: Diagnosis not present

## 2015-08-22 DIAGNOSIS — W228XXA Striking against or struck by other objects, initial encounter: Secondary | ICD-10-CM | POA: Insufficient documentation

## 2015-08-22 DIAGNOSIS — Z862 Personal history of diseases of the blood and blood-forming organs and certain disorders involving the immune mechanism: Secondary | ICD-10-CM | POA: Diagnosis not present

## 2015-08-22 DIAGNOSIS — Z79899 Other long term (current) drug therapy: Secondary | ICD-10-CM | POA: Insufficient documentation

## 2015-08-22 DIAGNOSIS — G43909 Migraine, unspecified, not intractable, without status migrainosus: Secondary | ICD-10-CM | POA: Insufficient documentation

## 2015-08-22 DIAGNOSIS — S6991XA Unspecified injury of right wrist, hand and finger(s), initial encounter: Secondary | ICD-10-CM | POA: Diagnosis present

## 2015-08-22 DIAGNOSIS — J45909 Unspecified asthma, uncomplicated: Secondary | ICD-10-CM | POA: Diagnosis not present

## 2015-08-22 MED ORDER — MELOXICAM 15 MG PO TABS: 15.0000 mg | ORAL_TABLET | Freq: Every day | ORAL | Status: DC

## 2015-08-22 MED ORDER — NAPROXEN 250 MG PO TABS
500.0000 mg | ORAL_TABLET | Freq: Once | ORAL | Status: AC
  Administered 2015-08-22: 500 mg via ORAL
  Filled 2015-08-22: qty 2

## 2015-08-22 NOTE — ED Notes (Signed)
Right hand injury at work. She was walking out the door and she ran into the frame.

## 2015-08-22 NOTE — ED Notes (Signed)
Right hand pain after hitting it on a door frame.  No swelling, no bruising or deformity present upon examination.

## 2015-08-22 NOTE — Discharge Instructions (Signed)
Cryotherapy °Cryotherapy means treatment with cold. Ice or gel packs can be used to reduce both pain and swelling. Ice is the most helpful within the first 24 to 48 hours after an injury or flare-up from overusing a muscle or joint. Sprains, strains, spasms, burning pain, shooting pain, and aches can all be eased with ice. Ice can also be used when recovering from surgery. Ice is effective, has very few side effects, and is safe for most people to use. °PRECAUTIONS  °Ice is not a safe treatment option for people with: °· Raynaud phenomenon. This is a condition affecting small blood vessels in the extremities. Exposure to cold may cause your problems to return. °· Cold hypersensitivity. There are many forms of cold hypersensitivity, including: °¨ Cold urticaria. Red, itchy hives appear on the skin when the tissues begin to warm after being iced. °¨ Cold erythema. This is a red, itchy rash caused by exposure to cold. °¨ Cold hemoglobinuria. Red blood cells break down when the tissues begin to warm after being iced. The hemoglobin that carry oxygen are passed into the urine because they cannot combine with blood proteins fast enough. °· Numbness or altered sensitivity in the area being iced. °If you have any of the following conditions, do not use ice until you have discussed cryotherapy with your caregiver: °· Heart conditions, such as arrhythmia, angina, or chronic heart disease. °· High blood pressure. °· Healing wounds or open skin in the area being iced. °· Current infections. °· Rheumatoid arthritis. °· Poor circulation. °· Diabetes. °Ice slows the blood flow in the region it is applied. This is beneficial when trying to stop inflamed tissues from spreading irritating chemicals to surrounding tissues. However, if you expose your skin to cold temperatures for too long or without the proper protection, you can damage your skin or nerves. Watch for signs of skin damage due to cold. °HOME CARE INSTRUCTIONS °Follow  these tips to use ice and cold packs safely. °· Place a dry or damp towel between the ice and skin. A damp towel will cool the skin more quickly, so you may need to shorten the time that the ice is used. °· For a more rapid response, add gentle compression to the ice. °· Ice for no more than 10 to 20 minutes at a time. The bonier the area you are icing, the less time it will take to get the benefits of ice. °· Check your skin after 5 minutes to make sure there are no signs of a poor response to cold or skin damage. °· Rest 20 minutes or more between uses. °· Once your skin is numb, you can end your treatment. You can test numbness by very lightly touching your skin. The touch should be so light that you do not see the skin dimple from the pressure of your fingertip. When using ice, most people will feel these normal sensations in this order: cold, burning, aching, and numbness. °· Do not use ice on someone who cannot communicate their responses to pain, such as small children or people with dementia. °HOW TO MAKE AN ICE PACK °Ice packs are the most common way to use ice therapy. Other methods include ice massage, ice baths, and cryosprays. Muscle creams that cause a cold, tingly feeling do not offer the same benefits that ice offers and should not be used as a substitute unless recommended by your caregiver. °To make an ice pack, do one of the following: °· Place crushed ice or a   bag of frozen vegetables in a sealable plastic bag. Squeeze out the excess air. Place this bag inside another plastic bag. Slide the bag into a pillowcase or place a damp towel between your skin and the bag. °· Mix 3 parts water with 1 part rubbing alcohol. Freeze the mixture in a sealable plastic bag. When you remove the mixture from the freezer, it will be slushy. Squeeze out the excess air. Place this bag inside another plastic bag. Slide the bag into a pillowcase or place a damp towel between your skin and the bag. °SEEK MEDICAL CARE  IF: °· You develop white spots on your skin. This may give the skin a blotchy (mottled) appearance. °· Your skin turns blue or pale. °· Your skin becomes waxy or hard. °· Your swelling gets worse. °MAKE SURE YOU:  °· Understand these instructions. °· Will watch your condition. °· Will get help right away if you are not doing well or get worse. °Document Released: 08/05/2011 Document Revised: 04/25/2014 Document Reviewed: 08/05/2011 °ExitCare® Patient Information ©2015 [employer]. This information is not intended to replace advice given to you by your health care provider. Make sure you discuss any questions you have with your health care provider. ° °

## 2015-08-22 NOTE — ED Notes (Signed)
Applied ice pack to right hand.

## 2015-08-22 NOTE — ED Provider Notes (Signed)
CSN: [id number]     Arrival date & time 08/22/15  2159 History  This chart was scribe for Glendon Dunwoody, MD by Angelene Giovanni, ED Scribe. The patient was seen in room MH09/MH09 and the patient's care was started at 11:04 PM.    Chief Complaint  Patient presents with  . Hand Injury   Patient is a 59 y.o. female presenting with hand injury. The history is provided by the patient. No language interpreter was used.  Hand Injury Location:  Hand Injury: yes   Mechanism of injury comment:  Hit hand on a door frame at work Hand location:  R hand Pain details:    Radiates to:  Does not radiate   Severity:  Moderate   Onset quality:  Sudden   Timing:  Constant   Progression:  Unchanged Chronicity:  New Foreign body present:  No foreign bodies Relieved by:  Nothing Worsened by:  Nothing tried Ineffective treatments:  NSAIDs Associated symptoms: no fever, no numbness, no stiffness, no swelling and no tingling   Risk factors: no frequent fractures   HPI Comments: Danielle Castillo is a 59 y.o. female who presents to the Emergency Department complaining of right hand injury that occurred at work today. She explains that she was walking through an automatic door and she hit her arm in the door. She states that she took Ibuprofen with no relief. She reports an allergy to ASA.    No PCP  Past Medical History  Diagnosis Date  . Allergy   . Asthma   . Migraine   . Anemia     chronic her whole life, had egd and colonoscopy in 2009 normal   Past Surgical History  Procedure Laterality Date  . Cesarean section    . Tonsillectomy     Family History  Problem Relation Age of Onset  . Diabetes Mother   . Hypertension Mother   . Hypertension Father   . Stroke Father   . Cancer Sister    Social History  Substance Use Topics  . Smoking status: Never Smoker   . Smokeless tobacco: None  . Alcohol Use: No   OB History    Gravida Para Term Preterm AB TAB SAB Ectopic Multiple Living   2 2              Review of Systems  Constitutional: Negative for fever and chills.  Musculoskeletal: Positive for myalgias and arthralgias. Negative for stiffness.  All other systems reviewed and are negative.     Allergies  Asa  Home Medications   Prior to Admission medications   Medication Sig Start Date End Date Taking? Authorizing Provider  albuterol (PROVENTIL HFA;VENTOLIN HFA) 108 (90 BASE) MCG/ACT inhaler Inhale 2 puffs into the lungs every 6 (six) hours as needed for wheezing.    Historical Provider, MD  beclomethasone (QVAR) 80 MCG/ACT inhaler Inhale 1 puff into the lungs as needed.    Historical Provider, MD  cyclobenzaprine (FLEXERIL) 5 MG tablet Take 1 tablet (5 mg total) by mouth 3 (three) times daily as needed for muscle spasms. 09/02/13   Terressa Koyanagi, DO  gabapentin (NEURONTIN) 100 MG capsule Take 1 capsule (100 mg total) by mouth 3 (three) times daily. 01/14/14   Erick Colace, MD  naproxen (NAPROSYN) 500 MG tablet Take 1 tablet (500 mg total) by mouth 2 (two) times daily with a meal. 01/14/14   Erick Colace, MD  NON FORMULARY Allergy shots    Historical Provider, MD  tobramycin (TOBREX) 0.3 % ophthalmic solution Place 2 drops into the right eye every 4 (four) hours. 09/26/14   Lonia Skinner Sofia, PA-C   BP 148/119 mmHg  Pulse 71  Temp(Src) 98.1 F (36.7 C) (Oral)  Resp 18  Ht 5' 1.5" (1.562 m)  Wt 156 lb (70.761 kg)  BMI 29.00 kg/m2  SpO2 99% Physical Exam  Constitutional: She is oriented to person, place, and time. She appears well-developed and well-nourished. No distress.  HENT:  Head: Normocephalic and atraumatic.  Mouth/Throat: Oropharynx is clear and moist.  Eyes: Conjunctivae and EOM are normal. Pupils are equal, round, and reactive to light.  Neck: Normal range of motion. Neck supple. No tracheal deviation present.  Cardiovascular: Normal rate, regular rhythm and normal heart sounds.   Pulmonary/Chest: Effort normal and breath sounds normal. No  respiratory distress. She has no wheezes. She has no rales.  Abdominal: Soft. Bowel sounds are normal.  Musculoskeletal: Normal range of motion.       Right wrist: She exhibits normal range of motion, no tenderness, no bony tenderness, no swelling, no effusion, no crepitus, no deformity and no laceration.       Right forearm: She exhibits no tenderness, no bony tenderness, no swelling, no edema, no deformity and no laceration.       Right hand: Normal. She exhibits normal range of motion, no tenderness, no bony tenderness, normal two-point discrimination, normal capillary refill, no deformity, no laceration and no swelling. Normal sensation noted. Normal strength noted.  Intact pronation and supination Cap refill less than 2 seconds DTR's intact  Neurological: She is alert and oriented to person, place, and time. She has normal reflexes.  Skin: Skin is warm and dry.  Psychiatric: She has a normal mood and affect. Her behavior is normal.  Nursing note and vitals reviewed.   ED Course  Procedures (including critical care time) DIAGNOSTIC STUDIES: Oxygen Saturation is 99% on, normal by my interpretation.    COORDINATION OF CARE: 11:07 PM- Pt advised of plan for treatment and pt agrees.    Labs Review Labs Reviewed - No data to display  Imaging Review Dg Hand Complete Right  08/22/2015   CLINICAL DATA:  Hit right hand on door, with pain at the mid hand, radiating up the arm. Initial encounter.  EXAM: RIGHT HAND - COMPLETE 3+ VIEW  COMPARISON:  None.  FINDINGS: There is no evidence of fracture or dislocation. Mild joint space narrowing and cortical irregularity are noted at the distal interphalangeal joints, compatible with mild osteoarthritis. The carpal rows are intact, and demonstrate normal alignment. The soft tissues are unremarkable in appearance.  IMPRESSION: 1. No evidence of fracture or dislocation. 2. Changes of mild osteoarthritis.   Electronically Signed   By: Roanna Raider M.D.    On: 08/22/2015 22:48   I have personally reviewed and evaluated these images and lab results as part of my medical decision-making.   EKG Interpretation None      MDM   Final diagnoses:  None    Hand injury.  Ice and elevation and continue NSAIDs    I personally performed the services described in this documentation, which was scribed in my presence. The recorded information has been reviewed and is accurate.    Cy Blamer, MD 08/23/15 [phone]

## 2015-08-23 ENCOUNTER — Encounter (HOSPITAL_BASED_OUTPATIENT_CLINIC_OR_DEPARTMENT_OTHER): Payer: Self-pay | Admitting: Emergency Medicine

## 2016-01-05 IMAGING — CR DG HAND COMPLETE 3+V*R*
3 series · 3 of 3 positions shown · non-contrast
Comparison: None.

CLINICAL DATA: Hit right hand on door, with pain at the mid hand,
radiating up the arm. Initial encounter.

EXAM:
RIGHT HAND - COMPLETE 3+ VIEW

[x hand pa right]
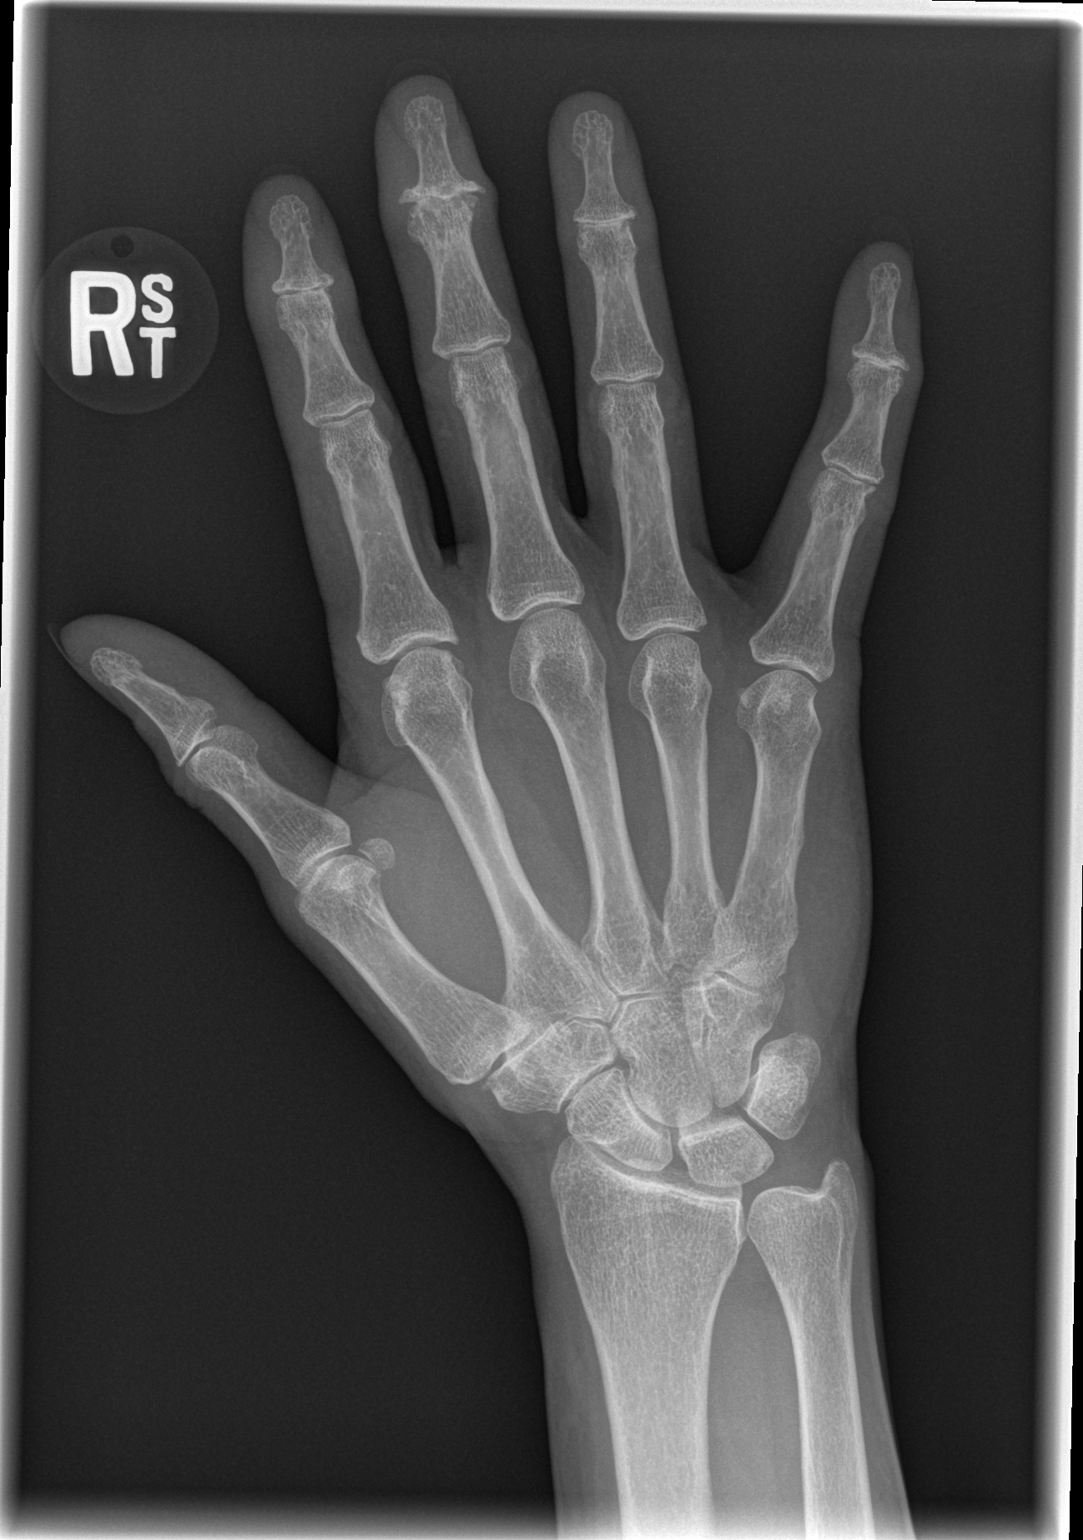

[x hand oblique right]
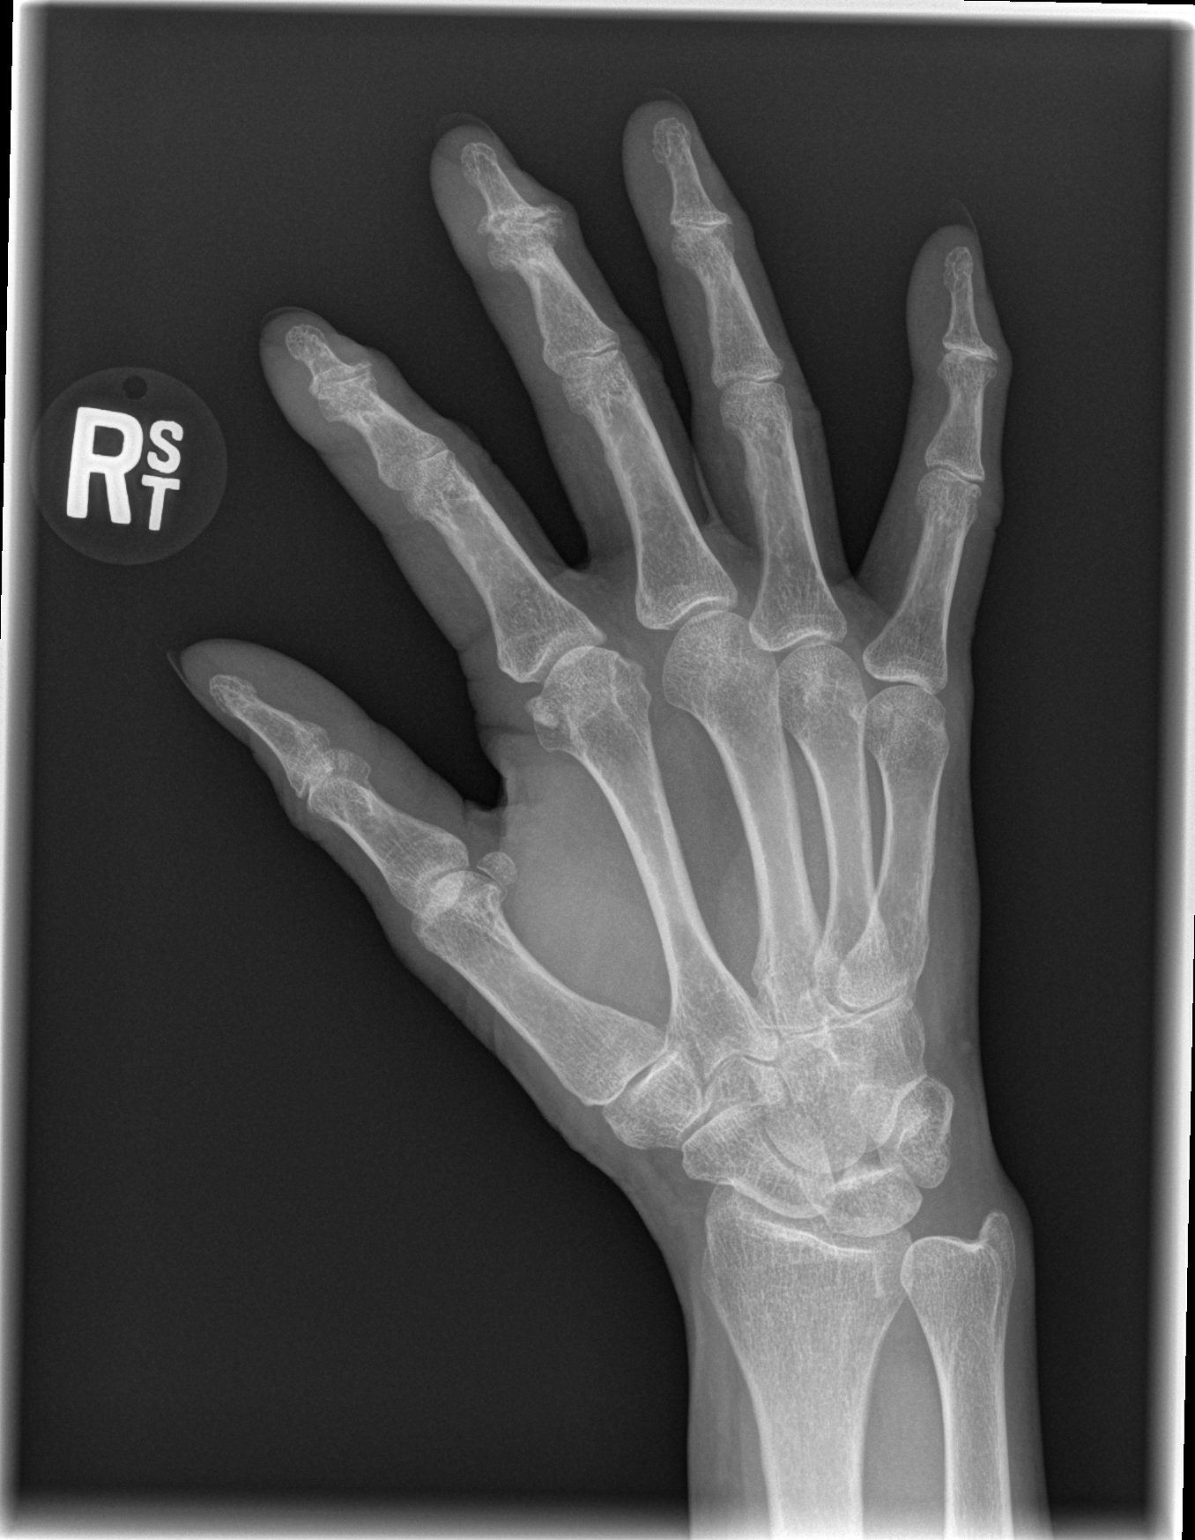

[x hand lat right]
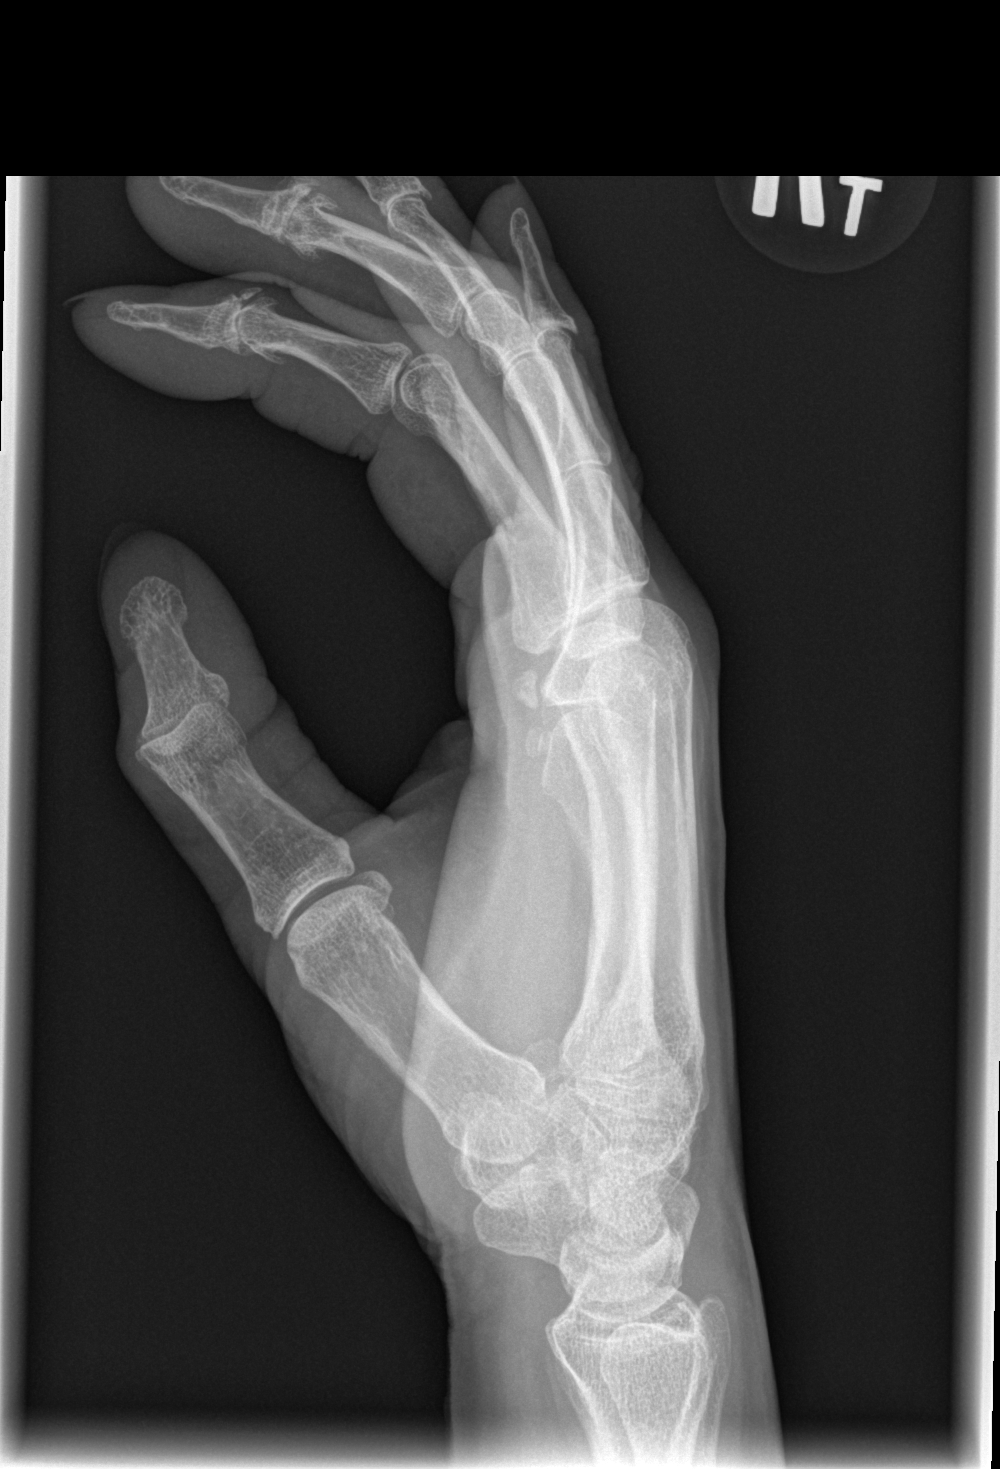

[3 of 3 positions shown; findings below may reference images not displayed]

FINDINGS: There is no evidence of fracture or dislocation. Mild joint space
narrowing and cortical irregularity are noted at the distal
interphalangeal joints, compatible with mild osteoarthritis. The
carpal rows are intact, and demonstrate normal alignment. The soft
tissues are unremarkable in appearance.
IMPRESSION: 1. No evidence of fracture or dislocation.
2. Changes of mild osteoarthritis.

## 2020-03-16 ENCOUNTER — Ambulatory Visit: Payer: Managed Care, Other (non HMO) | Attending: Internal Medicine

## 2020-03-16 DIAGNOSIS — Z23 Encounter for immunization: Secondary | ICD-10-CM

## 2020-03-16 NOTE — Progress Notes (Signed)
   Covid-19 Vaccination Clinic  Name:  Danielle Castillo    MRN: [id number] DOB: 11/04/1956  03/16/2020  [name] was observed post Covid-19 immunization for 15 minutes without incident. She was provided with Vaccine Information Sheet and instruction to access the V-Safe system.   [name] was instructed to call 911 with any severe reactions post vaccine: Marland Kitchen Difficulty breathing  . Swelling of face and throat  . A fast heartbeat  . A bad rash all over body  . Dizziness and weakness   Immunizations Administered    Name Date Dose VIS Date Route   Pfizer COVID-19 Vaccine 03/16/2020 10:54 AM 0.3 mL 12/03/2019 Intramuscular   Manufacturer: [employer], [employer]   Lot: [id number]   NDC: [address]-3

## 2020-04-10 ENCOUNTER — Ambulatory Visit: Payer: Managed Care, Other (non HMO) | Attending: Internal Medicine

## 2020-04-10 DIAGNOSIS — Z23 Encounter for immunization: Secondary | ICD-10-CM

## 2020-04-10 NOTE — Progress Notes (Signed)
   Covid-19 Vaccination Clinic  Name:  Danielle Castillo    MRN: [id number] DOB: 10-Aug-1956  04/10/2020  Danielle Castillo was observed post Covid-19 immunization for 15 minutes without incident. She was provided with Vaccine Information Sheet and instruction to access the V-Safe system.   Danielle Castillo was instructed to call 911 with any severe reactions post vaccine: Marland Kitchen Difficulty breathing  . Swelling of face and throat  . A fast heartbeat  . A bad rash all over body  . Dizziness and weakness   Immunizations Administered    Name Date Dose VIS Date Route   Pfizer COVID-19 Vaccine 04/10/2020 10:28 AM 0.3 mL 02/16/2019 Intramuscular   Manufacturer: [employer], [employer]   Lot: [id number]   NDC: [address]-6

## 2021-08-14 ENCOUNTER — Ambulatory Visit
Admission: EM | Admit: 2021-08-14 | Discharge: 2021-08-14 | Disposition: A | Discharge status: Home / Self Care | Payer: Medicare (Managed Care) | Attending: Emergency Medicine | Admitting: Emergency Medicine

## 2021-08-14 ENCOUNTER — Other Ambulatory Visit: Payer: Self-pay

## 2021-08-14 DIAGNOSIS — G5601 Carpal tunnel syndrome, right upper limb: Secondary | ICD-10-CM | POA: Diagnosis not present

## 2021-08-14 MED ORDER — PREDNISONE 10 MG PO TABS: ORAL_TABLET | ORAL | 0 refills | Status: DC

## 2021-08-14 MED ORDER — NAPROXEN 500 MG PO TABS: 500.0000 mg | ORAL_TABLET | Freq: Two times a day (BID) | ORAL | 0 refills | Status: DC

## 2021-08-14 NOTE — Discharge Instructions (Addendum)
Please wear wrist brace consistently over the next 2 weeks Begin prednisone taper x6 days-begin with 6 tablets on day 1, decrease by 1 tablet each day until complete-6, 5, 4, 3, 2, 1-take with food and earlier in the day if possible Supplement Naprosyn after completion of prednisone course as needed Follow-up if not improving or worsening

## 2021-08-14 NOTE — ED Provider Notes (Signed)
[facility]    CSN: [id number] Arrival date & time: 08/14/21  1147      History   Chief Complaint Chief Complaint  Patient presents with   Extremity Weakness    HPI Danielle Castillo is a 65 y.o. female presenting today for evaluation of right hand pain.  Reports over the past 2 weeks has had increased numbness tingling/pins and needle sensation into her right hand.  Initially in all 5 fingers, but now is extending into hand as well as forearm.  Denies injury or trauma.  Patient is right-handed.  Works as a Glass blower/designer often.  Also reports increased heavy lifting recently as she is moving.  Denies history of similar.  Denies associated elbow shoulder or neck pain.  Denies headache vision changes dizziness or lightheadedness.  Denies weakness.  Has not taking medicines for symptoms.  HPI  Past Medical History:  Diagnosis Date   Allergy    Anemia    chronic her whole life, had egd and colonoscopy in 2009 normal   Asthma    Migraine     Patient Active Problem List   Diagnosis Date Noted   Meralgia paresthetica of right side 01/14/2014   Degeneration of lumbar or lumbosacral intervertebral disc 01/14/2014   Lumbosacral spondylosis without myelopathy 01/14/2014   Back pain 09/02/2013   Encounter to establish care 09/02/2013   Asthma 09/02/2013   Seasonal allergies 09/02/2013    Past Surgical History:  Procedure Laterality Date   CESAREAN SECTION     TONSILLECTOMY      OB History     Gravida  2   Para  2   Term      Preterm      AB      Living         SAB      IAB      Ectopic      Multiple      Live Births               Home Medications    Prior to Admission medications   Medication Sig Start Date End Date Taking? Authorizing Provider  naproxen (NAPROSYN) 500 MG tablet Take 1 tablet (500 mg total) by mouth 2 (two) times daily. Use as needed After prednisone course 08/14/21  Yes Sanay Belmar C, PA-C  predniSONE (DELTASONE)  10 MG tablet Begin with 6 tabs on day 1, 5 tab on day 2, 4 tab on day 3, 3 tab on day 4, 2 tab on day 5, 1 tab on day 6-take with food 08/14/21  Yes Caz Weaver C, PA-C  albuterol (PROVENTIL HFA;VENTOLIN HFA) 108 (90 BASE) MCG/ACT inhaler Inhale 2 puffs into the lungs every 6 (six) hours as needed for wheezing.    [provider]  beclomethasone (QVAR) 80 MCG/ACT inhaler Inhale 1 puff into the lungs as needed.    [provider]  cyclobenzaprine (FLEXERIL) 5 MG tablet Take 1 tablet (5 mg total) by mouth 3 (three) times daily as needed for muscle spasms. 09/02/13   Terressa Koyanagi, DO  gabapentin (NEURONTIN) 100 MG capsule Take 1 capsule (100 mg total) by mouth 3 (three) times daily. 01/14/14   Kirsteins, Victorino Sparrow, MD  meloxicam (MOBIC) 15 MG tablet Take 1 tablet (15 mg total) by mouth daily. 08/22/15   Palumbo, April, MD  NON FORMULARY Allergy shots    [provider]  tobramycin (TOBREX) 0.3 % ophthalmic solution Place 2 drops into the right  eye every 4 (four) hours. 09/26/14   Elson Areas, PA-C    Family History Family History  Problem Relation Age of Onset   Diabetes Mother    Hypertension Mother    Hypertension Father    Stroke Father    Cancer Sister     Social History Social History   Tobacco Use   Smoking status: Never  Substance Use Topics   Alcohol use: No   Drug use: No     Allergies   Asa [aspirin]   Review of Systems Review of Systems  Constitutional:  Negative for fatigue and fever.  HENT:  Negative for mouth sores.   Eyes:  Negative for visual disturbance.  Respiratory:  Negative for shortness of breath.   Cardiovascular:  Negative for chest pain.  Gastrointestinal:  Negative for abdominal pain, nausea and vomiting.  Genitourinary:  Negative for genital sores.  Musculoskeletal:  Positive for arthralgias. Negative for joint swelling.  Skin:  Negative for color change, rash and wound.  Neurological:  Negative for dizziness,  weakness, light-headedness and headaches.    Physical Exam Triage Vital Signs ED Triage Vitals  Enc Vitals Group     BP      Pulse      Resp      Temp      Temp src      SpO2      Weight      Height      Head Circumference      Peak Flow      Pain Score      Pain Loc      Pain Edu?      Excl. in GC?    No data found.  Updated Vital Signs BP 109/70 (BP Location: Left Arm)   Pulse 68   Temp 97.9 F (36.6 C) (Oral)   Resp 18   SpO2 97%   Visual Acuity Right Eye Distance:   Left Eye Distance:   Bilateral Distance:    Right Eye Near:   Left Eye Near:    Bilateral Near:     Physical Exam Vitals and nursing note reviewed.  Constitutional:      Appearance: She is well-developed.     Comments: No acute distress  HENT:     Head: Normocephalic and atraumatic.     Nose: Nose normal.  Eyes:     Conjunctiva/sclera: Conjunctivae normal.  Cardiovascular:     Rate and Rhythm: Normal rate.  Pulmonary:     Effort: Pulmonary effort is normal. No respiratory distress.  Abdominal:     General: There is no distension.  Musculoskeletal:        General: Normal range of motion.     Cervical back: Neck supple.     Comments: Right hand: No obvious swelling deformity or discoloration, nontender to palpation of distal radius and ulna, nontender to palpation throughout dorsum of hand along the metacarpals and all 5 fingers, full active range of motion of all 5 fingers, grip strength 5/5 and equal bilaterally, finger abduction and abduction 5/5 and equal bilaterally, patient reports decreased sensation to touch compared to left, weakly positive Tinel and Phalen's, negative Finkelstein  Full active range of motion of right elbow shoulder neck  Skin:    General: Skin is warm and dry.  Neurological:     General: No focal deficit present.     Mental Status: She is alert and oriented to person, place, and time. Mental status is at baseline.  Cranial Nerves: No cranial nerve deficit.      Motor: No weakness.     UC Treatments / Results  Labs (all labs ordered are listed, but only abnormal results are displayed) Labs Reviewed - No data to display  EKG   Radiology No results found.  Procedures Procedures (including critical care time)  Medications Ordered in UC Medications - No data to display  Initial Impression / Assessment and Plan / UC Course  I have reviewed the triage vital signs and the nursing notes.  Pertinent labs & imaging results that were available during my care of the patient were reviewed by me and considered in my medical decision making (see chart for details).     Most suspicious of carpal tunnel causing symptoms times, strength intact, no associated weakness, recommending wrist brace x2 weeks, prednisone taper followed by NSAIDs, denies history of diabetes.  Suspect most likely peripheral, low suspicion of underlying central cause.  Discussed strict return precautions. Patient verbalized understanding and is agreeable with plan.  Final Clinical Impressions(s) / UC Diagnoses   Final diagnoses:  Right carpal tunnel syndrome     Discharge Instructions      Please wear wrist brace consistently over the next 2 weeks Begin prednisone taper x6 days-begin with 6 tablets on day 1, decrease by 1 tablet each day until complete-6, 5, 4, 3, 2, 1-take with food and earlier in the day if possible Supplement Naprosyn after completion of prednisone course as needed Follow-up if not improving or worsening     ED Prescriptions     Medication Sig Dispense Auth. Provider   predniSONE (DELTASONE) 10 MG tablet Begin with 6 tabs on day 1, 5 tab on day 2, 4 tab on day 3, 3 tab on day 4, 2 tab on day 5, 1 tab on day 6-take with food 21 tablet Karla Vines C, PA-C   naproxen (NAPROSYN) 500 MG tablet Take 1 tablet (500 mg total) by mouth 2 (two) times daily. Use as needed After prednisone course 30 tablet Rosary Filosa, [place] C, PA-C      PDMP not  reviewed this encounter.   Lew Dawes, [place] 08/14/21 1349

## 2021-08-14 NOTE — ED Triage Notes (Signed)
Pt c/o tingling sensation in right arm/hand, pt does state some numbness at times in the extremity. Pt denies chest pain and other pain associated. Pt states she does have a headaches at times as well as some vision changes (states difficult to see at times).

## 2023-06-25 ENCOUNTER — Ambulatory Visit
Admission: EM | Admit: 2023-06-25 | Discharge: 2023-06-25 | Disposition: A | Discharge status: Home / Self Care | Payer: Medicare (Managed Care) | Attending: Physician Assistant | Admitting: Physician Assistant

## 2023-06-25 DIAGNOSIS — R195 Other fecal abnormalities: Secondary | ICD-10-CM | POA: Diagnosis not present

## 2023-06-25 DIAGNOSIS — H109 Unspecified conjunctivitis: Secondary | ICD-10-CM

## 2023-06-25 DIAGNOSIS — R051 Acute cough: Secondary | ICD-10-CM

## 2023-06-25 DIAGNOSIS — B9689 Other specified bacterial agents as the cause of diseases classified elsewhere: Secondary | ICD-10-CM

## 2023-06-25 DIAGNOSIS — J45909 Unspecified asthma, uncomplicated: Secondary | ICD-10-CM | POA: Diagnosis not present

## 2023-06-25 DIAGNOSIS — B349 Viral infection, unspecified: Secondary | ICD-10-CM

## 2023-06-25 DIAGNOSIS — Z8616 Personal history of COVID-19: Secondary | ICD-10-CM | POA: Insufficient documentation

## 2023-06-25 DIAGNOSIS — Z1152 Encounter for screening for COVID-19: Secondary | ICD-10-CM | POA: Diagnosis not present

## 2023-06-25 DIAGNOSIS — H1089 Other conjunctivitis: Secondary | ICD-10-CM | POA: Insufficient documentation

## 2023-06-25 DIAGNOSIS — R0981 Nasal congestion: Secondary | ICD-10-CM | POA: Diagnosis present

## 2023-06-25 MED ORDER — BENZONATATE 100 MG PO CAPS: 100.0000 mg | ORAL_CAPSULE | Freq: Three times a day (TID) | ORAL | 0 refills | Status: DC

## 2023-06-25 MED ORDER — ERYTHROMYCIN 5 MG/GM OP OINT: TOPICAL_OINTMENT | OPHTHALMIC | 0 refills | Status: DC

## 2023-06-25 MED ORDER — FLUTICASONE PROPIONATE 50 MCG/ACT NA SUSP: 1.0000 | Freq: Every day | NASAL | 0 refills | Status: AC

## 2023-06-25 NOTE — ED Provider Notes (Signed)
[facility]    CSN: [id number] Arrival date & time: 06/25/23  1042      History   Chief Complaint No chief complaint on file.   HPI Danielle Castillo is a 67 y.o. female.   Patient presents today with 3 to 4-day history of URI symptoms.  Reports nasal congestion, cough, eye drainage.  Reports that the eye drainage has worsened and over the past several days she has had difficulty opening her eyes when she wakes up in the morning.  She does not wear glasses or contacts.  Denies any changes to make-up or exposure to any chemicals.  She has not been taking over-the-counter medication for symptom management.  She does report associated nausea and diarrhea.  Reports 2-4 loose bowel movements per day without blood or mucus.  She will have some abdominal discomfort just before having to use the bathroom but this then improves.  She denies any food intake, known sick contacts, recent travel.  Denies history of gastrointestinal disorder.  She has had cesarean section and hernia repair but denies additional abdominal surgery.  She does report that before her loose bowels started she was started on colchicine.  She has since discontinued this medication and her stools have improved but not yet resolved.  She has had COVID in the past.  Denies any recent antibiotics.  She does have a history of asthma but has not been using her albuterol inhaler more frequently since symptoms began.    Past Medical History:  Diagnosis Date   Allergy    Anemia    chronic her whole life, had egd and colonoscopy in 2009 normal   Asthma    Migraine     Patient Active Problem List   Diagnosis Date Noted   Meralgia paresthetica of right side 01/14/2014   Degeneration of lumbar or lumbosacral intervertebral disc 01/14/2014   Lumbosacral spondylosis without myelopathy 01/14/2014   Back pain 09/02/2013   Encounter to establish care 09/02/2013   Asthma 09/02/2013   Seasonal allergies 09/02/2013    Past  Surgical History:  Procedure Laterality Date   CESAREAN SECTION     TONSILLECTOMY      OB History     Gravida  2   Para  2   Term      Preterm      AB      Living         SAB      IAB      Ectopic      Multiple      Live Births               Home Medications    Prior to Admission medications   Medication Sig Start Date End Date Taking? Authorizing Provider  atorvastatin (LIPITOR) 20 MG tablet Take by mouth. 08/20/22  Yes [provider]  benzonatate (TESSALON) 100 MG capsule Take 1 capsule (100 mg total) by mouth every 8 (eight) hours. 06/25/23  Yes Melvia Matousek, Noberto Retort, PA-C  cholecalciferol (VITAMIN D3) 25 MCG (1000 UNIT) tablet TAKE 3 TABLETS (3,000 UNITS TOTAL) BY MOUTH DAILY. 11/18/22  Yes [provider]  Cyanocobalamin 1000 MCG SUBL Place under the tongue. 07/06/20  Yes [provider]  Dupilumab 300 MG/2ML SOPN Inject into the skin. 10/21/22 10/21/23 Yes [provider]  erythromycin ophthalmic ointment Place a 1/2 inch ribbon of ointment into the lower eyelid of both eyes twice daily for 7 days 06/25/23  Yes Magdalen Cabana, Noberto Retort, PA-C  fluticasone (FLONASE) 50 MCG/ACT nasal spray Place 1 spray into both nostrils daily. 06/25/23  Yes Shaquayla Klimas K, PA-C  leflunomide (ARAVA) 10 MG tablet Take 1 tablet by mouth daily. 01/27/23  Yes [provider]  levothyroxine (SYNTHROID) 75 MCG tablet Take 1 tablet at least 30 minutes before eating or taking other medications. Take with water only 08/20/22  Yes [provider]  albuterol (PROVENTIL HFA;VENTOLIN HFA) 108 (90 BASE) MCG/ACT inhaler Inhale 2 puffs into the lungs every 6 (six) hours as needed for wheezing.    [provider]  beclomethasone (QVAR) 80 MCG/ACT inhaler Inhale 1 puff into the lungs as needed.    [provider]  cyclobenzaprine (FLEXERIL) 5 MG tablet Take 1 tablet (5 mg total) by mouth 3 (three) times daily as needed for muscle spasms. 09/02/13    Terressa Koyanagi, DO  gabapentin (NEURONTIN) 100 MG capsule Take 1 capsule (100 mg total) by mouth 3 (three) times daily. 01/14/14   Kirsteins, Victorino Sparrow, MD  meloxicam (MOBIC) 15 MG tablet Take 1 tablet (15 mg total) by mouth daily. 08/22/15   Palumbo, April, MD  naproxen (NAPROSYN) 500 MG tablet Take 1 tablet (500 mg total) by mouth 2 (two) times daily. Use as needed After prednisone course 08/14/21   Wieters, Junius Creamer, PA-C  NON FORMULARY Allergy shots    [provider]  predniSONE (DELTASONE) 10 MG tablet Begin with 6 tabs on day 1, 5 tab on day 2, 4 tab on day 3, 3 tab on day 4, 2 tab on day 5, 1 tab on day 6-take with food 08/14/21   Wieters, Hallie C, PA-C  tobramycin (TOBREX) 0.3 % ophthalmic solution Place 2 drops into the right eye every 4 (four) hours. 09/26/14   Elson Areas, PA-C    Family History Family History  Problem Relation Age of Onset   Diabetes Mother    Hypertension Mother    Hypertension Father    Stroke Father    Cancer Sister     Social History Social History   Tobacco Use   Smoking status: Never  Substance Use Topics   Alcohol use: No   Drug use: No     Allergies   Asa [aspirin]   Review of Systems Review of Systems  Constitutional:  Positive for activity change, appetite change and fatigue. Negative for fever.  HENT:  Positive for congestion. Negative for sinus pressure, sneezing and sore throat.   Eyes:  Positive for discharge and redness. Negative for photophobia, pain, itching and visual disturbance.  Respiratory:  Positive for cough. Negative for shortness of breath.   Cardiovascular:  Negative for chest pain.  Gastrointestinal:  Positive for diarrhea and nausea. Negative for abdominal pain, blood in stool and vomiting.  Musculoskeletal:  Negative for arthralgias and myalgias.  Neurological:  Positive for headaches. Negative for dizziness and light-headedness.     Physical Exam Triage Vital Signs ED Triage Vitals  Enc Vitals Group      BP 06/25/23 1142 126/85     Pulse Rate 06/25/23 1141 76     Resp 06/25/23 1141 16     Temp 06/25/23 1141 98.2 F (36.8 C)     Temp Source 06/25/23 1141 Oral     SpO2 06/25/23 1141 100 %     Weight --      Height --      Head Circumference --      Peak Flow --      Pain Score  06/25/23 1145 0     Pain Loc --      Pain Edu? --      Excl. in GC? --    No data found.  Updated Vital Signs BP 126/85 (BP Location: Right Arm)   Pulse 76   Temp 98.2 F (36.8 C) (Oral)   Resp 16   SpO2 100%   Visual Acuity Right Eye Distance:   Left Eye Distance:   Bilateral Distance:    Right Eye Near:   Left Eye Near:    Bilateral Near:     Physical Exam Vitals reviewed.  Constitutional:      General: She is awake. She is not in acute distress.    Appearance: Normal appearance. She is well-developed. She is not ill-appearing.     Comments: Very pleasant female appears stated age in no acute distress sitting comfortably in exam room  HENT:     Head: Normocephalic and atraumatic.     Right Ear: Tympanic membrane, ear canal and external ear normal. Tympanic membrane is not erythematous or bulging.     Left Ear: Tympanic membrane, ear canal and external ear normal. Tympanic membrane is not erythematous or bulging.     Nose:     Right Sinus: No maxillary sinus tenderness or frontal sinus tenderness.     Left Sinus: No maxillary sinus tenderness or frontal sinus tenderness.     Mouth/Throat:     Pharynx: Uvula midline. No oropharyngeal exudate or posterior oropharyngeal erythema.  Eyes:     Extraocular Movements: Extraocular movements intact.     Conjunctiva/sclera:     Right eye: Right conjunctiva is injected.     Left eye: Left conjunctiva is injected.     Pupils: Pupils are equal, round, and reactive to light.     Comments: Drainage noted medial canthus bilaterally  Cardiovascular:     Rate and Rhythm: Normal rate and regular rhythm.     Heart sounds: Normal heart sounds, S1 normal  and S2 normal. No murmur heard. Pulmonary:     Effort: Pulmonary effort is normal.     Breath sounds: Normal breath sounds. No wheezing, rhonchi or rales.     Comments: Clear to auscultation bilaterally Abdominal:     General: Bowel sounds are normal.     Palpations: Abdomen is soft.     Tenderness: There is no abdominal tenderness. There is no right CVA tenderness, left CVA tenderness, guarding or rebound.     Comments: Benign abdominal exam   Musculoskeletal:     Cervical back: Normal range of motion and neck supple.  Psychiatric:        Behavior: Behavior is cooperative.      UC Treatments / Results  Labs (all labs ordered are listed, but only abnormal results are displayed) Labs Reviewed  SARS CORONAVIRUS 2 (TAT 6-24 HRS)    EKG   Radiology No results found.  Procedures Procedures (including critical care time)  Medications Ordered in UC Medications - No data to display  Initial Impression / Assessment and Plan / UC Course  I have reviewed the triage vital signs and the nursing notes.  Pertinent labs & imaging results that were available during my care of the patient were reviewed by me and considered in my medical decision making (see chart for details).     Patient is well-appearing, afebrile, nontoxic, nontachycardic.  Vital signs and physical exam are reassuring with no indication for emergent evaluation or imaging.  No evidence of acute  infection that would want to systemic antibiotics.  COVID testing was obtained and is pending.  Patient is a candidate for antiviral therapy given her age.  She has metabolic panel available in care everywhere from 06/18/2023 with creatinine of 0.81 and calculated creatinine clearance of 80 mL/min.  She would need to hold her atorvastatin and cyclobenzaprine while on Paxlovid and for 3 days after completing course.  She is not to take colchicine and Paxlovid but reports that she is already discontinued this medication due to side  effects.  This was removed from her medication list.  We will treat symptomatically with Tessalon for cough and Flonase for congestion.  We discussed that bilateral eye symptoms could be viral in nature but given significant overnight drainage will cover with erythromycin ointment twice daily.  She was instructed to touch the medication bottle to her eye and wash hands prior to him medication to prevent contamination of the medicine.  We also discussed that diarrhea could be related to viral illness but also could be related to colchicine.  She has had some improvement since stopping colchicine and so was encouraged to follow-up with her prescriber about neck steps but discontinued this medication.  She is eat a bland diet and drink plenty fluid.  Discussed that her symptoms are improving within a few days she is to return for reevaluation.  If she has any worsening symptoms she needs to be seen immediately.  Final Clinical Impressions(s) / UC Diagnoses   Final diagnoses:  Viral illness  Acute cough  Bacterial conjunctivitis of both eyes  Loose stools     Discharge Instructions      I am concerned that you have a virus.  We will contact you if you are positive for COVID.  Use Tessalon for cough.  Use fluticasone nasal spray to help with your congestion.  You can use over-the-counter medication including nasal saline/sinus rinses, Tylenol, Mucinex.  Make sure that you rest and drink plenty of fluid.  Use erythromycin ointment in your eyes to cover for bacterial infection.  Wash your hands prior to any medication and do not touch tip of medication bottle to the eye as this can contaminate the medicine.  If your symptoms or not improving within a week please return for reevaluation.  If anything worsens and you have fever, chest pain, shortness of breath, nausea/vomiting interfering with oral intake you need to be seen immediately.     ED Prescriptions     Medication Sig Dispense Auth. Provider    erythromycin ophthalmic ointment Place a 1/2 inch ribbon of ointment into the lower eyelid of both eyes twice daily for 7 days 3.5 g Chenel Wernli K, PA-C   benzonatate (TESSALON) 100 MG capsule Take 1 capsule (100 mg total) by mouth every 8 (eight) hours. 21 capsule Marquarius Lofton K, PA-C   fluticasone (FLONASE) 50 MCG/ACT nasal spray Place 1 spray into both nostrils daily. 16 g Ameira Alessandrini K, PA-C      PDMP not reviewed this encounter.   Jeani Hawking, PA-C 06/25/23 1214

## 2023-06-25 NOTE — Discharge Instructions (Addendum)
I am concerned that you have a virus.  We will contact you if you are positive for COVID.  Use Tessalon for cough.  Use fluticasone nasal spray to help with your congestion.  You can use over-the-counter medication including nasal saline/sinus rinses, Tylenol, Mucinex.  Make sure that you rest and drink plenty of fluid.  Use erythromycin ointment in your eyes to cover for bacterial infection.  Wash your hands prior to any medication and do not touch tip of medication bottle to the eye as this can contaminate the medicine.  If your symptoms or not improving within a week please return for reevaluation.  If anything worsens and you have fever, chest pain, shortness of breath, nausea/vomiting interfering with oral intake you need to be seen immediately.

## 2023-06-25 NOTE — ED Triage Notes (Signed)
Pt presents to UC w/ c/o diarrhea, itchy eyes, head congestion, watery eyes x4 days.  Denies fevers or vomiting. Reports decreased appetite, nausea.

## 2023-06-26 LAB — SARS CORONAVIRUS 2 (TAT 6-24 HRS): SARS Coronavirus 2: NEGATIVE

## 2024-02-02 ENCOUNTER — Emergency Department (HOSPITAL_BASED_OUTPATIENT_CLINIC_OR_DEPARTMENT_OTHER): Payer: Medicare HMO

## 2024-02-02 ENCOUNTER — Emergency Department (HOSPITAL_BASED_OUTPATIENT_CLINIC_OR_DEPARTMENT_OTHER)
Admission: EM | Admit: 2024-02-02 | Discharge: 2024-02-02 | Disposition: A | Discharge status: Home / Self Care | Payer: Medicare HMO | Attending: Emergency Medicine | Admitting: Emergency Medicine

## 2024-02-02 ENCOUNTER — Other Ambulatory Visit: Payer: Self-pay

## 2024-02-02 ENCOUNTER — Encounter (HOSPITAL_BASED_OUTPATIENT_CLINIC_OR_DEPARTMENT_OTHER): Payer: Self-pay | Admitting: Emergency Medicine

## 2024-02-02 DIAGNOSIS — Z79899 Other long term (current) drug therapy: Secondary | ICD-10-CM | POA: Diagnosis not present

## 2024-02-02 DIAGNOSIS — M25552 Pain in left hip: Secondary | ICD-10-CM | POA: Diagnosis not present

## 2024-02-02 DIAGNOSIS — R079 Chest pain, unspecified: Secondary | ICD-10-CM | POA: Diagnosis present

## 2024-02-02 DIAGNOSIS — R072 Precordial pain: Secondary | ICD-10-CM | POA: Insufficient documentation

## 2024-02-02 DIAGNOSIS — J45909 Unspecified asthma, uncomplicated: Secondary | ICD-10-CM | POA: Insufficient documentation

## 2024-02-02 LAB — CBC
HCT: 35.8 % — ABNORMAL LOW (ref 36.0–46.0)
Hemoglobin: 11.6 g/dL — ABNORMAL LOW (ref 12.0–15.0)
MCH: 28.2 pg (ref 26.0–34.0)
MCHC: 32.4 g/dL (ref 30.0–36.0)
MCV: 87.1 fL (ref 80.0–100.0)
Platelets: 209 10*3/uL (ref 150–400)
RBC: 4.11 MIL/uL (ref 3.87–5.11)
RDW: 12.3 % (ref 11.5–15.5)
WBC: 4.7 10*3/uL (ref 4.0–10.5)
nRBC: 0 % (ref 0.0–0.2)

## 2024-02-02 LAB — BASIC METABOLIC PANEL
Anion gap: 8 (ref 5–15)
BUN: 8 mg/dL (ref 8–23)
CO2: 24 mmol/L (ref 22–32)
Calcium: 9.4 mg/dL (ref 8.9–10.3)
Chloride: 109 mmol/L (ref 98–111)
Creatinine, Ser: 0.61 mg/dL (ref 0.44–1.00)
GFR, Estimated: >60 mL/min (ref >60)
Glucose, Bld: 83 mg/dL (ref 70–99)
Potassium: 3.5 mmol/L (ref 3.5–5.1)
Sodium: 141 mmol/L (ref 135–145)

## 2024-02-02 LAB — TROPONIN I (HIGH SENSITIVITY)
Troponin I (High Sensitivity): 5 ng/L (ref <18)
Troponin I (High Sensitivity): 6 ng/L (ref <18)

## 2024-02-02 LAB — D-DIMER, QUANTITATIVE: D-Dimer, Quant: 0.33 ug{FEU}/mL (ref 0.00–0.50)

## 2024-02-02 MED ORDER — ALUM & MAG HYDROXIDE-SIMETH 200-200-20 MG/5ML PO SUSP
30.0000 mL | Freq: Once | ORAL | Status: AC
  Administered 2024-02-02: 30 mL via ORAL
  Filled 2024-02-02: qty 30

## 2024-02-02 MED ORDER — FAMOTIDINE 20 MG PO TABS
20.0000 mg | ORAL_TABLET | Freq: Once | ORAL | Status: AC
  Administered 2024-02-02: 20 mg via ORAL
  Filled 2024-02-02: qty 1

## 2024-02-02 NOTE — ED Triage Notes (Signed)
 Pt POV steady gait- c/o Shob, cough, L hip pain, CP x 1.5 week. Denies fever today.

## 2024-02-02 NOTE — ED Provider Notes (Signed)
 [place] [facility] Provider Note   CSN: [id number] Arrival date & time: 02/02/24  1320     History  Chief Complaint  Patient presents with   Chest Pain   Leg Pain    Danielle Castillo is a 68 y.o. female.   Chest Pain Leg Pain 68 year old female history of migraines, asthma present for chest pain and left hip pain.  The left hip pain has been ongoing for about 2-1/2 weeks.  It is left anterior hip pain.  Occasionally goes down the leg.  No weakness or numbness.  No bowel or bladder changes or saddle anesthesia.  Occasional back pain but not consistent.  No back pain currently.  She is able to ambulate.  She is not had any trauma.  She also reports about a week of chest pain.  It is substernal, nonradiating.  Nonexertional.  Maybe some shortness of breath.  It is better when she drinks water but then comes back.  Has history of GERD although states this feels different.  No history of PE or DVT and no recent travel.  No leg swelling.     Home Medications Prior to Admission medications   Medication Sig Start Date End Date Taking? Authorizing Provider  albuterol (PROVENTIL HFA;VENTOLIN HFA) 108 (90 BASE) MCG/ACT inhaler Inhale 2 puffs into the lungs every 6 (six) hours as needed for wheezing.    [provider]  atorvastatin (LIPITOR) 20 MG tablet Take by mouth. 08/20/22   [provider]  beclomethasone (QVAR) 80 MCG/ACT inhaler Inhale 1 puff into the lungs as needed.    [provider]  benzonatate  (TESSALON ) 100 MG capsule Take 1 capsule (100 mg total) by mouth every 8 (eight) hours. 06/25/23   Raspet, Erin K, PA-C  cholecalciferol (VITAMIN D3) 25 MCG (1000 UNIT) tablet TAKE 3 TABLETS (3,000 UNITS TOTAL) BY MOUTH DAILY. 11/18/22   [provider]  Cyanocobalamin 1000 MCG SUBL Place under the tongue. 07/06/20   [provider]  cyclobenzaprine  (FLEXERIL ) 5 MG tablet Take 1 tablet (5 mg total) by mouth 3  (three) times daily as needed for muscle spasms. 09/02/13   Maurie Southern, DO  erythromycin  ophthalmic ointment Place a 1/2 inch ribbon of ointment into the lower eyelid of both eyes twice daily for 7 days 06/25/23   Raspet, Erin K, PA-C  fluticasone  (FLONASE ) 50 MCG/ACT nasal spray Place 1 spray into both nostrils daily. 06/25/23   Raspet, Erin K, PA-C  gabapentin  (NEURONTIN ) 100 MG capsule Take 1 capsule (100 mg total) by mouth 3 (three) times daily. 01/14/14   Kirsteins, Cecilia Coe, MD  leflunomide (ARAVA) 10 MG tablet Take 1 tablet by mouth daily. 01/27/23   [provider]  levothyroxine (SYNTHROID) 75 MCG tablet Take 1 tablet at least 30 minutes before eating or taking other medications. Take with water only 08/20/22   [provider]  meloxicam  (MOBIC ) 15 MG tablet Take 1 tablet (15 mg total) by mouth daily. 08/22/15   Palumbo, April, MD  naproxen  (NAPROSYN ) 500 MG tablet Take 1 tablet (500 mg total) by mouth 2 (two) times daily. Use as needed After prednisone  course 08/14/21   Wieters, Hallie C, PA-C  NON FORMULARY Allergy shots    [provider]  predniSONE  (DELTASONE ) 10 MG tablet Begin with 6 tabs on day 1, 5 tab on day 2, 4 tab on day 3, 3 tab on day 4, 2 tab on day 5, 1 tab on day 6-take  with food 08/14/21   Wieters, Hallie C, PA-C  tobramycin  (TOBREX ) 0.3 % ophthalmic solution Place 2 drops into the right eye every 4 (four) hours. 09/26/14   Sandi Crosby, PA-C      Allergies    Asa [aspirin]    Review of Systems   Review of Systems  Cardiovascular:  Positive for chest pain.  Review of systems completed and notable as per HPI.  ROS otherwise negative.   Physical Exam Updated Vital Signs BP 134/82   Pulse 80   Temp 98.5 F (36.9 C) (Oral)   Resp 16   Ht 5\' 1"  (1.549 m)   SpO2 99%   BMI 29.48 kg/m  Physical Exam Vitals and nursing note reviewed.  Constitutional:      General: She is not in acute distress.    Appearance: She is well-developed.  HENT:      Head: Normocephalic and atraumatic.  Eyes:     Conjunctiva/sclera: Conjunctivae normal.  Cardiovascular:     Rate and Rhythm: Normal rate and regular rhythm.     Pulses: Normal pulses.     Heart sounds: Normal heart sounds. No murmur heard. Pulmonary:     Effort: Pulmonary effort is normal. No respiratory distress.     Breath sounds: Normal breath sounds.  Abdominal:     Palpations: Abdomen is soft.     Tenderness: There is no abdominal tenderness.  Musculoskeletal:        General: No swelling.     Cervical back: Neck supple.     Right lower leg: No edema.     Left lower leg: No edema.     Comments: No skin changes or swelling over the left hip.  Some mild pain with range of motion but good strength with hip, knee, ankle.  Distal pulses intact.  Leg is warm and well-perfused.  Normal femoral pulse.  Sensation normal throughout.  Skin:    General: Skin is warm and dry.     Capillary Refill: Capillary refill takes less than 2 seconds.  Neurological:     Mental Status: She is alert.  Psychiatric:        Mood and Affect: Mood normal.     ED Results / Procedures / Treatments   Labs (all labs ordered are listed, but only abnormal results are displayed) Labs Reviewed  CBC - Abnormal; Notable for the following components:      Result Value   Hemoglobin 11.6 (*)    HCT 35.8 (*)    All other components within normal limits  BASIC METABOLIC PANEL  D-DIMER, QUANTITATIVE  TROPONIN I (HIGH SENSITIVITY)  TROPONIN I (HIGH SENSITIVITY)    EKG EKG Interpretation Date/Time:  Monday February 02 2024 13:57:13 EST Ventricular Rate:  89 PR Interval:  150 QRS Duration:  74 QT Interval:  356 QTC Calculation: 433 R Axis:   49  Text Interpretation: Normal sinus rhythm Normal ECG No previous ECGs available Confirmed by Rueben Cote [id number]) on 02/02/2024 6:46:18 PM  Radiology DG Hip Unilat With Pelvis 2-3 Views Left Result Date: 02/02/2024 CLINICAL DATA:  Hip pain for 2 months.  No  history of injury EXAM: DG HIP (WITH OR WITHOUT PELVIS) 3V LEFT COMPARISON:  None Available. FINDINGS: No fracture or dislocation. Mild concentric joint space loss of both hips. There is also sclerosis and joint space loss of the sacroiliac joints, right-greater-than-left. Presumed tubal ligation clips along the central pelvis. IMPRESSION: Diffuse scattered mild degenerative changes. Electronically Signed   By: Alice Innocent  Venice Gillis M.D.   On: 02/02/2024 20:12   DG Chest 2 View Result Date: 02/02/2024 CLINICAL DATA:  Cough and shortness of breath. EXAM: CHEST - 2 VIEW COMPARISON:  Chest radiograph dated 12/13/2021. CT chest dated 01/28/2022. FINDINGS: The heart size and mediastinal contours are within normal limits. Aortic atherosclerosis. No focal consolidation, pleural effusion, or pneumothorax. No acute osseous abnormality. IMPRESSION: No acute cardiopulmonary findings. Electronically Signed   By: Mannie Seek M.D.   On: 02/02/2024 15:52    Procedures Procedures    Medications Ordered in ED Medications  alum & mag hydroxide-simeth (MAALOX/MYLANTA) 200-200-20 MG/5ML suspension 30 mL (30 mLs Oral Given 02/02/24 1920)  famotidine  (PEPCID ) tablet 20 mg (20 mg Oral Given 02/02/24 1920)    ED Course/ Medical Decision Making/ A&P Clinical Course as of 02/02/24 2351  Mon Feb 02, 2024  2104 On reassessment pain and blood pressure improved.  She her D-dimer is negative, troponin negative x 2 low concern for ACS.  X-rays with some degenerative changes but no other acute findings.  Recommend close follow-up with PCP, return precautions given.  Patient is comfortable with this plan. [JD]    Clinical Course User Index [JD] Coleman Daughters, MD                                 Medical Decision Making Amount and/or Complexity of Data Reviewed Labs: ordered. Radiology: ordered.  Risk OTC drugs.   Medical Decision Making:   Romney Robbins is a 68 y.o. female who presented to the ED today with left hip  pain and chest pain.  All signs reviewed.  Going her left hip pain is atraumatic, seems more musculoskeletal in nature.  No signs of cauda equina syndrome or neurovascular compromise.  Will obtain x-ray.  She also has some chest pain.  It is somewhat pleuritic, will obtain D-dimer evaluation possibly PE.  Troponin normal, nonischemic EKG less consistent with ACS.  Low suspicion for dissection.  It is better when she drinks water, will treat for possible GERD and see if this helps.   Patient placed on continuous vitals and telemetry monitoring while in ED which was reviewed periodically.  Reviewed and confirmed nursing documentation for past medical history, family history, social history.  Reassessment and Plan:   On reassessment pain and blood pressure improved.  She her D-dimer is negative, troponin negative x 2 low concern for ACS.  X-rays with some degenerative changes but no other acute findings.  Recommend close follow-up with PCP, return precautions given.  Patient is comfortable with this plan.    Patient's presentation is most consistent with acute complicated illness / injury requiring diagnostic workup.           Final Clinical Impression(s) / ED Diagnoses Final diagnoses:  Chest pain, unspecified type  Left hip pain    Rx / DC Orders ED Discharge Orders     None         Coleman Daughters, MD 02/02/24 2351

## 2024-02-02 NOTE — Discharge Instructions (Signed)
 Your lab work and x-rays today were overall reassuring.  I recommend you follow-up closely with your primary care doctor.  If you develop worsening pain, difficulty breathing, weakness or numbness or any other new concerning symptoms you should return to the ED.

## 2024-06-13 ENCOUNTER — Emergency Department (HOSPITAL_BASED_OUTPATIENT_CLINIC_OR_DEPARTMENT_OTHER)

## 2024-06-13 ENCOUNTER — Encounter (HOSPITAL_BASED_OUTPATIENT_CLINIC_OR_DEPARTMENT_OTHER): Payer: Self-pay

## 2024-06-13 ENCOUNTER — Other Ambulatory Visit: Payer: Self-pay

## 2024-06-13 ENCOUNTER — Observation Stay (HOSPITAL_BASED_OUTPATIENT_CLINIC_OR_DEPARTMENT_OTHER)
Admission: EM | Admit: 2024-06-13 | Discharge: 2024-06-16 | Disposition: A | Discharge status: Home / Self Care | Attending: Internal Medicine | Admitting: Internal Medicine

## 2024-06-13 DIAGNOSIS — R531 Weakness: Secondary | ICD-10-CM | POA: Diagnosis not present

## 2024-06-13 DIAGNOSIS — E0500 Thyrotoxicosis with diffuse goiter without thyrotoxic crisis or storm: Principal | ICD-10-CM | POA: Insufficient documentation

## 2024-06-13 DIAGNOSIS — R42 Dizziness and giddiness: Secondary | ICD-10-CM | POA: Diagnosis present

## 2024-06-13 DIAGNOSIS — Z79899 Other long term (current) drug therapy: Secondary | ICD-10-CM | POA: Insufficient documentation

## 2024-06-13 DIAGNOSIS — R55 Syncope and collapse: Principal | ICD-10-CM | POA: Diagnosis present

## 2024-06-13 DIAGNOSIS — E785 Hyperlipidemia, unspecified: Secondary | ICD-10-CM | POA: Insufficient documentation

## 2024-06-13 DIAGNOSIS — J45909 Unspecified asthma, uncomplicated: Secondary | ICD-10-CM | POA: Diagnosis not present

## 2024-06-13 DIAGNOSIS — E440 Moderate protein-calorie malnutrition: Secondary | ICD-10-CM | POA: Diagnosis not present

## 2024-06-13 DIAGNOSIS — E039 Hypothyroidism, unspecified: Secondary | ICD-10-CM | POA: Diagnosis not present

## 2024-06-13 DIAGNOSIS — R0609 Other forms of dyspnea: Secondary | ICD-10-CM | POA: Insufficient documentation

## 2024-06-13 LAB — URINALYSIS, ROUTINE W REFLEX MICROSCOPIC
Bilirubin Urine: NEGATIVE
Glucose, UA: NEGATIVE mg/dL
Hgb urine dipstick: NEGATIVE
Ketones, ur: NEGATIVE mg/dL
Nitrite: NEGATIVE
Protein, ur: NEGATIVE mg/dL
Specific Gravity, Urine: 1.02 (ref 1.005–1.030)
pH: 6 (ref 5.0–8.0)

## 2024-06-13 LAB — CBC WITH DIFFERENTIAL/PLATELET
Abs Immature Granulocytes: 0 10*3/uL (ref 0.00–0.07)
Basophils Absolute: 0 10*3/uL (ref 0.0–0.1)
Basophils Relative: 0 %
Eosinophils Absolute: 0.1 10*3/uL (ref 0.0–0.5)
Eosinophils Relative: 2 %
HCT: 38.7 % (ref 36.0–46.0)
Hemoglobin: 12.2 g/dL (ref 12.0–15.0)
Immature Granulocytes: 0 %
Lymphocytes Relative: 46 %
Lymphs Abs: 2.4 10*3/uL (ref 0.7–4.0)
MCH: 27.5 pg (ref 26.0–34.0)
MCHC: 31.5 g/dL (ref 30.0–36.0)
MCV: 87.2 fL (ref 80.0–100.0)
Monocytes Absolute: 0.6 10*3/uL (ref 0.1–1.0)
Monocytes Relative: 11 %
Neutro Abs: 2.2 10*3/uL (ref 1.7–7.7)
Neutrophils Relative %: 41 %
Platelets: 178 10*3/uL (ref 150–400)
RBC: 4.44 MIL/uL (ref 3.87–5.11)
RDW: 13 % (ref 11.5–15.5)
WBC: 5.2 10*3/uL (ref 4.0–10.5)
nRBC: 0 % (ref 0.0–0.2)

## 2024-06-13 LAB — COMPREHENSIVE METABOLIC PANEL WITH GFR
ALT: 47 U/L — ABNORMAL HIGH (ref 0–44)
AST: 57 U/L — ABNORMAL HIGH (ref 15–41)
Albumin: 4.1 g/dL (ref 3.5–5.0)
Alkaline Phosphatase: 186 U/L — ABNORMAL HIGH (ref 38–126)
Anion gap: 11 (ref 5–15)
BUN: 9 mg/dL (ref 8–23)
CO2: 25 mmol/L (ref 22–32)
Calcium: 10.2 mg/dL (ref 8.9–10.3)
Chloride: 106 mmol/L (ref 98–111)
Creatinine, Ser: 0.51 mg/dL (ref 0.44–1.00)
GFR, Estimated: >60 mL/min (ref >60)
Glucose, Bld: 94 mg/dL (ref 70–99)
Potassium: 4.2 mmol/L (ref 3.5–5.1)
Sodium: 142 mmol/L (ref 135–145)
Total Bilirubin: 0.3 mg/dL (ref 0.0–1.2)
Total Protein: 7.4 g/dL (ref 6.5–8.1)

## 2024-06-13 LAB — URINALYSIS, MICROSCOPIC (REFLEX): RBC / HPF: NONE SEEN RBC/hpf (ref 0–5)

## 2024-06-13 LAB — RESP PANEL BY RT-PCR (RSV, FLU A&B, COVID)  RVPGX2
Influenza A by PCR: NEGATIVE
Influenza B by PCR: NEGATIVE
Resp Syncytial Virus by PCR: NEGATIVE
SARS Coronavirus 2 by RT PCR: NEGATIVE

## 2024-06-13 LAB — T4, FREE: Free T4: 1.64 ng/dL — ABNORMAL HIGH (ref 0.61–1.12)

## 2024-06-13 LAB — TROPONIN T, HIGH SENSITIVITY
Troponin T High Sensitivity: <15 ng/L (ref <19)
Troponin T High Sensitivity: <15 ng/L (ref <19)

## 2024-06-13 LAB — MAGNESIUM: Magnesium: 1.8 mg/dL (ref 1.7–2.4)

## 2024-06-13 LAB — LIPASE, BLOOD: Lipase: 54 U/L — ABNORMAL HIGH (ref 11–51)

## 2024-06-13 LAB — TSH: TSH: <0.01 u[IU]/mL — ABNORMAL LOW (ref 0.350–4.500)

## 2024-06-13 LAB — PRO BRAIN NATRIURETIC PEPTIDE: Pro Brain Natriuretic Peptide: 441 pg/mL — ABNORMAL HIGH (ref <300.0)

## 2024-06-13 MED ORDER — ATENOLOL 25 MG PO TABS
25.0000 mg | ORAL_TABLET | Freq: Once | ORAL | Status: AC
  Administered 2024-06-13: 25 mg via ORAL
  Filled 2024-06-13: qty 1

## 2024-06-13 MED ORDER — LACTATED RINGERS IV BOLUS
1000.0000 mL | Freq: Once | INTRAVENOUS | Status: AC
  Administered 2024-06-13: 1000 mL via INTRAVENOUS

## 2024-06-13 MED ORDER — IOHEXOL 350 MG/ML SOLN
75.0000 mL | Freq: Once | INTRAVENOUS | Status: AC | PRN
  Administered 2024-06-13: 75 mL via INTRAVENOUS

## 2024-06-13 MED ORDER — ONDANSETRON HCL 4 MG/2ML IJ SOLN
4.0000 mg | Freq: Once | INTRAMUSCULAR | Status: AC
  Administered 2024-06-13: 4 mg via INTRAVENOUS
  Filled 2024-06-13: qty 2

## 2024-06-13 NOTE — ED Triage Notes (Signed)
 Pt presents with hx of dizziness, nausea, weakness X few months. Worsened over the passed 2-3 days. Pt newly diagnose with graves diseases

## 2024-06-13 NOTE — ED Provider Notes (Signed)
 [place] [facility] Provider Note  CSN: [id number] Arrival date & time: 06/13/24 1243  Chief Complaint(s) No chief complaint on file.  HPI Danielle Castillo is a 68 y.o. female with past medical history as below, significant for chronic anemia, meralgia paresthetica, asthma, migraine headaches who presents to the ED with complaint of dizzy, nausea, weakness over 2 to 3 months  She was seen primary care office on 6/16 after recent diagnosis of Graves' disease.  She was started on methimazole and atenolol  She is pending outpatient follow-up with endocrinology in August  She reports her symptoms today are simply worsening of the symptoms she has been experiencing over the past few months.  She is having generalized malaise, weight loss, body aches, exertional dyspnea, dizziness with standing or postural changes, nausea, intermittent swelling to her legs.   Past Medical History Past Medical History:  Diagnosis Date  . Allergy   . Anemia    chronic her whole life, had egd and colonoscopy in 2009 normal  . Asthma   . Migraine    Patient Active Problem List   Diagnosis Date Noted  . Meralgia paresthetica of right side 01/14/2014  . Degeneration of lumbar or lumbosacral intervertebral disc 01/14/2014  . Lumbosacral spondylosis without myelopathy 01/14/2014  . Back pain 09/02/2013  . Encounter to establish care 09/02/2013  . Asthma 09/02/2013  . Seasonal allergies 09/02/2013   Home Medication(s) Prior to Admission medications   Medication Sig Start Date End Date Taking? Authorizing Provider  albuterol (PROVENTIL HFA;VENTOLIN HFA) 108 (90 BASE) MCG/ACT inhaler Inhale 2 puffs into the lungs every 6 (six) hours as needed for wheezing.    [provider]  atorvastatin (LIPITOR) 20 MG tablet Take by mouth. 08/20/22   [provider]  beclomethasone (QVAR) 80 MCG/ACT inhaler Inhale 1 puff into the lungs as needed.    [provider]  benzonatate  (TESSALON ) 100 MG capsule Take 1 capsule (100 mg total) by mouth every 8 (eight) hours. 06/25/23   Raspet, Erin K, PA-C  cholecalciferol (VITAMIN D3) 25 MCG (1000 UNIT) tablet TAKE 3 TABLETS (3,000 UNITS TOTAL) BY MOUTH DAILY. 11/18/22   [provider]  Cyanocobalamin 1000 MCG SUBL Place under the tongue. 07/06/20   [provider]  cyclobenzaprine  (FLEXERIL ) 5 MG tablet Take 1 tablet (5 mg total) by mouth 3 (three) times daily as needed for muscle spasms. 09/02/13   Luke Chiquita SAUNDERS, DO  erythromycin  ophthalmic ointment Place a 1/2 inch ribbon of ointment into the lower eyelid of both eyes twice daily for 7 days 06/25/23   Raspet, Erin K, PA-C  fluticasone  (FLONASE ) 50 MCG/ACT nasal spray Place 1 spray into both nostrils daily. 06/25/23   Raspet, Erin K, PA-C  gabapentin  (NEURONTIN ) 100 MG capsule Take 1 capsule (100 mg total) by mouth 3 (three) times daily. 01/14/14   Kirsteins, Prentice BRAVO, MD  leflunomide (ARAVA) 10 MG tablet Take 1 tablet by mouth daily. 01/27/23   [provider]  levothyroxine (SYNTHROID) 75 MCG tablet Take 1 tablet at least 30 minutes before eating or taking other medications. Take with water only 08/20/22   [provider]  meloxicam  (MOBIC ) 15 MG tablet Take 1 tablet (15 mg total) by mouth daily. 08/22/15   Palumbo, April, MD  naproxen  (NAPROSYN ) 500 MG tablet Take 1 tablet (500 mg total) by mouth 2 (two) times daily. Use as needed After prednisone  course 08/14/21   Wieters, Hallie C, PA-C  NON FORMULARY Allergy  shots    [provider]  predniSONE  (DELTASONE ) 10 MG tablet Begin with 6 tabs on day 1, 5 tab on day 2, 4 tab on day 3, 3 tab on day 4, 2 tab on day 5, 1 tab on day 6-take with food 08/14/21   Wieters, Hallie C, PA-C  tobramycin  (TOBREX ) 0.3 % ophthalmic solution Place 2 drops into the right eye every 4 (four) hours. 09/26/14   Flint Sonny POUR, PA-C                                                                                                                                     Past Surgical History Past Surgical History:  Procedure Laterality Date  . CESAREAN SECTION    . TONSILLECTOMY     Family History Family History  Problem Relation Age of Onset  . Diabetes Mother   . Hypertension Mother   . Hypertension Father   . Stroke Father   . Cancer Sister     Social History Social History   Tobacco Use  . Smoking status: Never  Substance Use Topics  . Alcohol use: No  . Drug use: No   Allergies Asa [aspirin]  Review of Systems A thorough review of systems was obtained and all systems are negative except as noted in the HPI and PMH.   Physical Exam Vital Signs  I have reviewed the triage vital signs There were no vitals taken for this visit. Physical Exam Vitals and nursing note reviewed.  Constitutional:      General: She is not in acute distress.    Appearance: Normal appearance.  HENT:     Head: Normocephalic and atraumatic.     Right Ear: External ear normal.     Left Ear: External ear normal.     Nose: Nose normal.     Mouth/Throat:     Mouth: Mucous membranes are dry.   Eyes:     General: No scleral icterus.       Right eye: No discharge.        Left eye: No discharge.    Cardiovascular:     Rate and Rhythm: Normal rate and regular rhythm.     Pulses: Normal pulses.     Heart sounds: Normal heart sounds.  Pulmonary:     Effort: Pulmonary effort is normal. No respiratory distress.     Breath sounds: Normal breath sounds. No stridor.  Abdominal:     General: Abdomen is flat. There is no distension.     Palpations: Abdomen is soft.     Tenderness: There is no abdominal tenderness.   Musculoskeletal:     Cervical back: No rigidity.     Right lower leg: No edema.     Left lower leg: No edema.   Skin:    General: Skin is warm and dry.     Capillary Refill: Capillary refill takes less than 2  seconds.   Neurological:     Mental Status: She is alert.   Psychiatric:         Mood and Affect: Mood normal.        Behavior: Behavior normal. Behavior is cooperative.    ED Results and Treatments Labs (all labs ordered are listed, but only abnormal results are displayed) Labs Reviewed - No data to display                                                                                                                        Radiology No results found.  Pertinent labs & imaging results that were available during my care of the patient were reviewed by me and considered in my medical decision making (see MDM for details).  Medications Ordered in ED Medications - No data to display                                                                                                                                   Procedures Procedures  (including critical care time)  Medical Decision Making / ED Course    Medical Decision Making:    Ayeisha Lindenberger is a 68 y.o. female ***. The complaint involves an extensive differential diagnosis and also carries with it a high risk of complications and morbidity.  Serious etiology was considered. Ddx includes but is not limited to: ***  Complete initial physical exam performed, notably the patient was in ***.    Reviewed and confirmed nursing documentation for past medical history, family history, social history.  Vital signs reviewed.     Brief summary:  ***       ***             Additional history obtained: -Additional history obtained from {wsadditionalhistorian:28072} -External records from outside source obtained and reviewed including: Chart review including previous notes, labs, imaging, consultation notes including  ***   Lab Tests: -I ordered, reviewed, and interpreted labs.   The pertinent results include:   Labs Reviewed - No data to display  Notable for ***  EKG   EKG Interpretation Date/Time:    Ventricular Rate:    PR Interval:    QRS Duration:    QT Interval:    QTC  Calculation:   R Axis:      Text Interpretation:           Imaging Studies ordered: I  ordered imaging studies including *** I independently visualized the following imaging with scope of interpretation limited to determining acute life threatening conditions related to emergency care; findings noted above I agree with the radiologist interpretation If any imaging was obtained with contrast I closely monitored patient for any possible adverse reaction a/w contrast administration in the emergency department   Medicines ordered and prescription drug management: No orders of the defined types were placed in this encounter.   -I have reviewed the patients home medicines and have made adjustments as needed   Consultations Obtained: I requested consultation with the ***,  and discussed lab and imaging findings as well as pertinent plan - they recommend: ***   Cardiac Monitoring: The patient was maintained on a cardiac monitor.  I personally viewed and interpreted the cardiac monitored which showed an underlying rhythm of: *** Continuous pulse oximetry interpreted by myself, ***% on ***.    Social Determinants of Health:  Diagnosis or treatment significantly limited by social determinants of health: {wssoc:28071}   Reevaluation: After the interventions noted above, I reevaluated the patient and found that they have {resolved/improved/worsened:23923::improved}  Co morbidities that complicate the patient evaluation . Past Medical History:  Diagnosis Date  . Allergy   . Anemia    chronic her whole life, had egd and colonoscopy in 2009 normal  . Asthma   . Migraine       Dispostion: Disposition decision including need for hospitalization was considered, and patient {wsdispo:28070::discharged from emergency department.}    Final Clinical Impression(s) / ED Diagnoses Final diagnoses:  None

## 2024-06-13 NOTE — Progress Notes (Signed)
 Plan of Care Note for accepted transfer   Patient: Danielle Castillo MRN: [id number]   DOA: 06/13/2024  Facility requesting transfer: [facility] Requesting Provider: Ula Barter, MD Reason for transfer: PRESYNCOPE, ELEVATED BNP Facility course: Lora Chavers is a 68 y.o. female with past medical history as below, significant for chronic anemia, meralgia paresthetica, asthma, migraine headaches who presents to the ED with complaint of dizzy, nausea, weakness over 2 to 3 months   She was seen primary care office on 6/16 after recent diagnosis of Graves' disease.  She was started on methimazole and atenolol   She is pending outpatient follow-up with endocrinology in August   She reports her symptoms today are simply worsening of the symptoms she has been experiencing over the past few months.  She is having generalized malaise, weight loss, body aches, exertional dyspnea, dizziness with standing or postural changes, nausea, intermittent swelling to her legs.   Upon presentation to the ED, BP was 151/81 with RR 24. Labs revealed: Alk phos 186, liaase 54, ast57, alt 47 and bnp 441, neg trop x2,cbc nl, tsh <0.01, Free T4 1.64, resp panel negative, UA neg.  Chest XR NL.  EKG: Sinus rhythm with a rate of 89.  She was given 4 mg IV zofran, atenolo 25 mg po and 1 L bolus of IV LR.  I recommended chest CTA to rule out PE.  Plan of care: The patient is accepted for admission to Telemetry unit, at [facility]..  She will be under the responsibility of the EDP until arrival to [facility].  Author Madison DELENA Peaches, MD 06/13/2024  Check [email or web address] for on-call coverage.  Nursing staff, Please call [facility] Admits & Consults System-Wide number on Amion as soon as patient's arrival, so appropriate admitting provider can evaluate the pt.

## 2024-06-13 NOTE — ED Notes (Signed)
Provided patient with ginger ale

## 2024-06-13 NOTE — ED Notes (Signed)
 While doing orthostatic vitals, pt started tremoring and said that she became dizzy. She was able to stand long enough to get her blood pressure. States that it is not normal for her to be unable to stand like this.   Aleck FORBES Molt, RN

## 2024-06-13 NOTE — ED Provider Notes (Signed)
 I received the patient in signout with plans for reevaluation after her thyroid studies come back.  The patient's TSH is undetectable but T4 is actually downtrending compared to her most recent labs as an outpatient.  I went to discuss this with the patient.  She is still feeling very lightheaded.  She is given additional IV fluids.  On reassessment we did have the patient ambulate or attempt to and she required assistance and is feeling very shaky and lightheaded.  We are unable to obtain orthostatic vital signs for this reason.  Given her persistent symptoms calls placed to hospital service for admission.  Dr. Manzi will accept the patient.  Physical Exam  BP (!) 157/89 (BP Location: Right Arm)   Pulse 80   Temp 98.7 F (37.1 C) (Oral)   Resp (!) 21   SpO2 100%   Physical Exam General: No acute distress  Procedures  Procedures  ED Course / MDM    Medical Decision Making Amount and/or Complexity of Data Reviewed Labs: ordered. Radiology: ordered.  Risk Prescription drug management. Decision regarding hospitalization.          Ula Prentice SAUNDERS, MD 06/13/24 2159

## 2024-06-14 DIAGNOSIS — Z7401 Bed confinement status: Secondary | ICD-10-CM | POA: Diagnosis not present

## 2024-06-14 DIAGNOSIS — R55 Syncope and collapse: Secondary | ICD-10-CM | POA: Diagnosis present

## 2024-06-14 MED ORDER — METHIMAZOLE 10 MG PO TABS: 20.0000 mg | ORAL_TABLET | Freq: Two times a day (BID) | ORAL | Status: DC

## 2024-06-14 MED ORDER — ENSURE PLUS HIGH PROTEIN PO LIQD
237.0000 mL | Freq: Two times a day (BID) | ORAL | Status: DC
  Administered 2024-06-15 – 2024-06-16 (×4): 237 mL via ORAL

## 2024-06-14 MED ORDER — TRAZODONE HCL 50 MG PO TABS: 25.0000 mg | ORAL_TABLET | Freq: Every evening | ORAL | Status: DC | PRN

## 2024-06-14 MED ORDER — ONDANSETRON HCL 4 MG PO TABS: 4.0000 mg | ORAL_TABLET | Freq: Four times a day (QID) | ORAL | Status: DC | PRN

## 2024-06-14 MED ORDER — METHIMAZOLE 10 MG PO TABS
10.0000 mg | ORAL_TABLET | Freq: Two times a day (BID) | ORAL | Status: DC
  Administered 2024-06-14 – 2024-06-15 (×2): 10 mg via ORAL
  Filled 2024-06-14 (×2): qty 1

## 2024-06-14 MED ORDER — ONDANSETRON HCL 4 MG/2ML IJ SOLN: 4.0000 mg | Freq: Four times a day (QID) | INTRAMUSCULAR | Status: DC | PRN

## 2024-06-14 MED ORDER — ALBUTEROL SULFATE (2.5 MG/3ML) 0.083% IN NEBU: 2.5000 mg | INHALATION_SOLUTION | RESPIRATORY_TRACT | Status: DC | PRN

## 2024-06-14 MED ORDER — ENOXAPARIN SODIUM 40 MG/0.4ML IJ SOSY
40.0000 mg | PREFILLED_SYRINGE | Freq: Every day | INTRAMUSCULAR | Status: DC
  Administered 2024-06-14: 40 mg via SUBCUTANEOUS
  Filled 2024-06-14 (×2): qty 0.4

## 2024-06-14 MED ORDER — CYCLOBENZAPRINE HCL 5 MG PO TABS: 5.0000 mg | ORAL_TABLET | Freq: Three times a day (TID) | ORAL | Status: DC | PRN

## 2024-06-14 MED ORDER — ACETAMINOPHEN 650 MG RE SUPP: 650.0000 mg | Freq: Four times a day (QID) | RECTAL | Status: DC | PRN

## 2024-06-14 MED ORDER — METHIMAZOLE 10 MG PO TABS
10.0000 mg | ORAL_TABLET | Freq: Two times a day (BID) | ORAL | Status: DC
  Administered 2024-06-14: 10 mg via ORAL
  Filled 2024-06-14: qty 1

## 2024-06-14 MED ORDER — ACETAMINOPHEN 325 MG PO TABS
650.0000 mg | ORAL_TABLET | Freq: Four times a day (QID) | ORAL | Status: DC | PRN
Start: 2024-06-14 — End: 2024-06-16

## 2024-06-14 MED ORDER — ATENOLOL 25 MG PO TABS
25.0000 mg | ORAL_TABLET | Freq: Every day | ORAL | Status: DC
  Administered 2024-06-15 – 2024-06-16 (×2): 25 mg via ORAL
  Filled 2024-06-14 (×2): qty 1

## 2024-06-14 NOTE — H&P (Addendum)
 History and Physical  Danielle Castillo FMW:[id number] DOB: 04/03/1956 DOA: 06/13/2024  PCP: Pcp, No   Chief Complaint: Dizziness, weight loss, generalized malaise  HPI: Danielle Castillo is a 68 y.o. female with medical history significant for chronic anemia, hypothyroidism, recent diagnosis of Graves' disease recently started Tapazole on 6/16 being admitted to the hospital with continued dizziness, weight loss and generalized malaise.  It seems that she was having hyperthyroid symptoms since last October, her PCP discontinued her Synthroid dose which she says was stable for many months.  States that her TSH was never followed by her PCP.  Regardless, she had thyroid ultrasound and lab work done, consistent with Graves' disease.  On 6/16 her new PCP started her on methimazole 10 mg p.o. twice daily, as well as atenolol 25 mg p.o. daily.  She has continued to have gradual weight loss, generalized malaise, and dizziness with ambulation.  On review of her outpatient lab work, T4 has improved from 3.8 on 05/28/2019 5 to 1.6 in the ER on 6/22.  TSH remains undetectable.  Due to continued malaise and dizziness, she was admitted to the hospital service for observation.  Review of Systems: Please see HPI for pertinent positives and negatives. A complete 10 system review of systems are otherwise negative.  Past Medical History:  Diagnosis Date   Allergy    Anemia    chronic her whole life, had egd and colonoscopy in 2009 normal   Asthma    Migraine    Past Surgical History:  Procedure Laterality Date   CESAREAN SECTION     TONSILLECTOMY     Social History:  reports that she has never smoked. She does not have any smokeless tobacco history on file. She reports that she does not drink alcohol and does not use drugs.  Allergies  Allergen Reactions   Asa [Aspirin]     Gi upset    Family History  Problem Relation Age of Onset   Diabetes Mother    Hypertension Mother    Hypertension Father    Stroke  Father    Cancer Sister      Prior to Admission medications   Medication Sig Start Date End Date Taking? Authorizing Provider  albuterol (PROVENTIL HFA;VENTOLIN HFA) 108 (90 BASE) MCG/ACT inhaler Inhale 2 puffs into the lungs every 6 (six) hours as needed for wheezing.    [provider]  atorvastatin (LIPITOR) 20 MG tablet Take by mouth. 08/20/22   [provider]  beclomethasone (QVAR) 80 MCG/ACT inhaler Inhale 1 puff into the lungs as needed.    [provider]  benzonatate  (TESSALON ) 100 MG capsule Take 1 capsule (100 mg total) by mouth every 8 (eight) hours. 06/25/23   Raspet, Erin K, PA-C  cholecalciferol (VITAMIN D3) 25 MCG (1000 UNIT) tablet TAKE 3 TABLETS (3,000 UNITS TOTAL) BY MOUTH DAILY. 11/18/22   [provider]  Cyanocobalamin 1000 MCG SUBL Place under the tongue. 07/06/20   [provider]  cyclobenzaprine  (FLEXERIL ) 5 MG tablet Take 1 tablet (5 mg total) by mouth 3 (three) times daily as needed for muscle spasms. 09/02/13   Luke Chiquita SAUNDERS, DO  erythromycin  ophthalmic ointment Place a 1/2 inch ribbon of ointment into the lower eyelid of both eyes twice daily for 7 days 06/25/23   Raspet, Erin K, PA-C  fluticasone  (FLONASE ) 50 MCG/ACT nasal spray Place 1 spray into both nostrils daily. 06/25/23   Raspet, Rocky POUR, PA-C  gabapentin  (NEURONTIN ) 100 MG capsule Take 1 capsule (100 mg  total) by mouth 3 (three) times daily. 01/14/14   Kirsteins, Prentice BRAVO, MD  leflunomide (ARAVA) 10 MG tablet Take 1 tablet by mouth daily. 01/27/23   [provider]  levothyroxine (SYNTHROID) 75 MCG tablet Take 1 tablet at least 30 minutes before eating or taking other medications. Take with water only 08/20/22   [provider]  meloxicam  (MOBIC ) 15 MG tablet Take 1 tablet (15 mg total) by mouth daily. 08/22/15   Palumbo, April, MD  naproxen  (NAPROSYN ) 500 MG tablet Take 1 tablet (500 mg total) by mouth 2 (two) times daily. Use as needed After prednisone  course  08/14/21   Wieters, Hallie C, PA-C  NON FORMULARY Allergy shots    [provider]  predniSONE  (DELTASONE ) 10 MG tablet Begin with 6 tabs on day 1, 5 tab on day 2, 4 tab on day 3, 3 tab on day 4, 2 tab on day 5, 1 tab on day 6-take with food 08/14/21   Wieters, Hallie C, PA-C  tobramycin  (TOBREX ) 0.3 % ophthalmic solution Place 2 drops into the right eye every 4 (four) hours. 09/26/14   Flint Sonny POUR, PA-C    Physical Exam: BP (!) 128/59 (BP Location: Right Arm)   Pulse 64   Temp 98.1 F (36.7 C)   Resp 17   SpO2 99%  General:  Alert, oriented, calm, in no acute distress, she is thin but in no acute distress looks comfortable Cardiovascular: RRR, no murmurs or rubs, no peripheral edema  Respiratory: clear to auscultation bilaterally, no wheezes, no crackles  Abdomen: soft, nontender, nondistended, normal bowel tones heard  Skin: dry, no rashes  Musculoskeletal: no joint effusions, normal range of motion  Psychiatric: appropriate affect, normal speech  Neurologic: extraocular muscles intact, clear speech, moving all extremities with intact sensorium         Labs on Admission:  Basic Metabolic Panel: Recent Labs  Lab 06/13/24 1257  NA 142  K 4.2  CL 106  CO2 25  GLUCOSE 94  BUN 9  CREATININE 0.51  CALCIUM 10.2  MG 1.8   Liver Function Tests: Recent Labs  Lab 06/13/24 1257  AST 57*  ALT 47*  ALKPHOS 186*  BILITOT 0.3  PROT 7.4  ALBUMIN 4.1   Recent Labs  Lab 06/13/24 1257  LIPASE 54*   No results for input(s): AMMONIA in the last 168 hours. CBC: Recent Labs  Lab 06/13/24 1257  WBC 5.2  NEUTROABS 2.2  HGB 12.2  HCT 38.7  MCV 87.2  PLT 178   Cardiac Enzymes: No results for input(s): CKTOTAL, CKMB, CKMBINDEX, TROPONINI in the last 168 hours. BNP (last 3 results) No results for input(s): BNP in the last 8760 hours.  ProBNP (last 3 results) Recent Labs    06/13/24 1315  PROBNP 441.0*    CBG: No results for input(s): GLUCAP  in the last 168 hours.  Radiological Exams on Admission: CT Angio Chest PE W and/or Wo Contrast Result Date: 06/13/2024 CLINICAL DATA:  Generalized weakness for several days, initial encounter EXAM: CT ANGIOGRAPHY CHEST WITH CONTRAST TECHNIQUE: Multidetector CT imaging of the chest was performed using the standard protocol during bolus administration of intravenous contrast. Multiplanar CT image reconstructions and MIPs were obtained to evaluate the vascular anatomy. RADIATION DOSE REDUCTION: This exam was performed according to the departmental dose-optimization program which includes automated exposure control, adjustment of the mA and/or kV according to patient size and/or use of iterative reconstruction technique. CONTRAST:  75mL OMNIPAQUE IOHEXOL 350 MG/ML  SOLN COMPARISON:  01/28/2022, chest x-ray from earlier in the same day. FINDINGS: Cardiovascular: Atherosclerotic calcifications of the thoracic aorta are noted. No aneurysmal dilatation or dissection is noted. Heart is at the upper limits of normal in size. The pulmonary artery shows a normal branching pattern bilaterally. No intraluminal filling defect is identified to suggest pulmonary embolism. No significant coronary calcifications are noted. Mediastinum/Nodes: Thoracic inlet is within normal limits. No hilar or mediastinal adenopathy is noted. The esophagus as visualized is within normal limits. Lungs/Pleura: Lungs are well aerated bilaterally. No focal infiltrate or sizable effusion is seen. No sizable parenchymal nodule is noted. Upper Abdomen: No acute abnormality noted. Musculoskeletal: No chest wall abnormality. No acute or significant osseous findings. Review of the MIP images confirms the above findings. IMPRESSION: No evidence of pulmonary emboli. No acute abnormality noted. Electronically Signed   By: Oneil Devonshire M.D.   On: 06/13/2024 21:05   DG Chest 2 View Result Date: 06/13/2024 CLINICAL DATA:  Dizziness with nausea and weakness a  few months. Newly diagnosed Graves disease. EXAM: CHEST - 2 VIEW COMPARISON:  02/02/2024 FINDINGS: Lungs are adequately inflated and otherwise clear. Cardiomediastinal silhouette and remainder of the exam is unchanged. IMPRESSION: No active cardiopulmonary disease. Electronically Signed   By: Toribio Agreste M.D.   On: 06/13/2024 13:49   Assessment/Plan Danielle Castillo is a 68 y.o. female with medical history significant for chronic anemia, hypothyroidism, recent diagnosis of Graves' disease recently started Tapazole on 6/16 being admitted to the hospital with continued dizziness, weight loss and generalized malaise.  Graves' disease-with continued dizziness, weight loss and generalized malaise.  Vital signs are unremarkable, no evidence of thyroid storm -Observation -Supportive care -Continue methimazole and atenolol -Based on T4, she would technically qualify for severe Graves' disease and could increase her dose of methimazole.  This was discussed with the patient, who would like to think about it and stay on the stable dose of methimazole for the time being  Other chronic home medications will be resumed once reconciled by pharmacy staff.  DVT prophylaxis: Lovenox     Code Status: Full Code  Consults called: None  Admission status: Observation  Time spent: 48 minutes  Iren Whipp CHRISTELLA Gail MD [facility] Pager [phone]  If 7PM-7AM, please contact night-coverage [email or web address] Password [email or web address]  06/14/2024, 2:59 PM

## 2024-06-14 NOTE — ED Notes (Signed)
Pt provided coffee  

## 2024-06-14 NOTE — ED Notes (Signed)
Pt went to the bathroom via wheelchair

## 2024-06-15 DIAGNOSIS — R55 Syncope and collapse: Secondary | ICD-10-CM

## 2024-06-15 LAB — CBC
HCT: 36.8 % (ref 36.0–46.0)
Hemoglobin: 11.5 g/dL — ABNORMAL LOW (ref 12.0–15.0)
MCH: 27.7 pg (ref 26.0–34.0)
MCHC: 31.3 g/dL (ref 30.0–36.0)
MCV: 88.7 fL (ref 80.0–100.0)
Platelets: 157 10*3/uL (ref 150–400)
RBC: 4.15 MIL/uL (ref 3.87–5.11)
RDW: 12.7 % (ref 11.5–15.5)
WBC: 4.2 10*3/uL (ref 4.0–10.5)
nRBC: 0 % (ref 0.0–0.2)

## 2024-06-15 LAB — BASIC METABOLIC PANEL WITH GFR
Anion gap: 8 (ref 5–15)
BUN: 8 mg/dL (ref 8–23)
CO2: 26 mmol/L (ref 22–32)
Calcium: 9.5 mg/dL (ref 8.9–10.3)
Chloride: 104 mmol/L (ref 98–111)
Creatinine, Ser: 0.48 mg/dL (ref 0.44–1.00)
GFR, Estimated: >60 mL/min (ref >60)
Glucose, Bld: 94 mg/dL (ref 70–99)
Potassium: 3.6 mmol/L (ref 3.5–5.1)
Sodium: 138 mmol/L (ref 135–145)

## 2024-06-15 LAB — HIV ANTIBODY (ROUTINE TESTING W REFLEX): HIV Screen 4th Generation wRfx: NONREACTIVE

## 2024-06-15 MED ORDER — METHIMAZOLE 10 MG PO TABS: 20.0000 mg | ORAL_TABLET | Freq: Two times a day (BID) | ORAL | Status: DC

## 2024-06-15 MED ORDER — SODIUM CHLORIDE 0.9 % IV SOLN: INTRAVENOUS | Status: DC

## 2024-06-15 MED ORDER — ENOXAPARIN SODIUM 40 MG/0.4ML IJ SOSY
40.0000 mg | PREFILLED_SYRINGE | Freq: Every day | INTRAMUSCULAR | Status: DC
  Administered 2024-06-15: 40 mg via SUBCUTANEOUS

## 2024-06-15 MED ORDER — METHIMAZOLE 10 MG PO TABS
10.0000 mg | ORAL_TABLET | Freq: Two times a day (BID) | ORAL | Status: DC
  Administered 2024-06-15 – 2024-06-16 (×2): 10 mg via ORAL
  Filled 2024-06-15 (×2): qty 1

## 2024-06-15 NOTE — Evaluation (Signed)
 Physical Therapy Evaluation Patient Details Name: Danielle Castillo MRN: [id number] DOB: 1956/01/19 Today's Date: 06/15/2024  History of Present Illness  68 yo female presents to therapy following hospital admission on 06/13/2024 due to dizziness, weight loss and generalized malaise. Recent dx of Graves' dz and started on Tapazole 6/16 and reports near syncope episode with constant dizziness over the past few months impacting her ability to work/IADLs as well as ADLs and ambulation. Pt has PMH including but not limited to anemia, hypothyroidism, and asthma.  Clinical Impression   Pt admitted with above diagnosis.  Pt currently with functional limitations due to the deficits listed below (see PT Problem List). Pt in bed when PT arrived. PT communicated with nurse and nurse tech prior to entering room and informed negative findings for orthostatic hypotension just prior to PT arrival. Pt indicated mild dizziness with rolling to the left and with head turns once in standing no apparent nystagmus and no known prior hx of vertigo however not able to rule out at this time. Pt reports vision changes but attributed to need for cataract surgery. Pt is S for bed mobility, CGA for sit to stand from EOB to RW with cues, gait tasks in hallway with RW, CGA and min cues, pt reported dizziness with gait tasks more pronounced then with bed mobility and standing and required seated therapeutic rest break, once dizziness resolved pt able to ambulate with RW and additional 50 feet. Pt reports feelign more safe and stable with the RW. Pt elected to return to bed and all needs in place. Pt will benefit from acute skilled PT to increase their independence and safety with mobility to allow discharge.         If plan is discharge home, recommend the following: A little help with walking and/or transfers;A little help with bathing/dressing/bathroom;Assistance with cooking/housework;Assist for transportation;Help with stairs or ramp  for entrance   Can travel by private vehicle        Equipment Recommendations Rolling walker (2 wheels)  Recommendations for Other Services       Functional Status Assessment Patient has had a recent decline in their functional status and demonstrates the ability to make significant improvements in function in a reasonable and predictable amount of time.     Precautions / Restrictions Precautions Precautions: Fall Restrictions Weight Bearing Restrictions Per Provider Order: No      Mobility  Bed Mobility Overal bed mobility: Needs Assistance Bed Mobility: Supine to Sit, Sit to Supine     Supine to sit: Supervision Sit to supine: Supervision   General bed mobility comments: min cues and use of hospital bed    Transfers Overall transfer level: Needs assistance Equipment used: Rolling walker (2 wheels) Transfers: Sit to/from Stand Sit to Stand: Contact guard assist           General transfer comment: min cues and reports of dizziness with standing    Ambulation/Gait Ambulation/Gait assistance: Contact guard assist Gait Distance (Feet): 80 Feet Assistive device: Rolling walker (2 wheels) Gait Pattern/deviations: Step-through pattern, Narrow base of support Gait velocity: decreased     General Gait Details: min cues for safety and RW mangement, pt reported dizziness and required seated theraputic rest break following 30 feet of amb with RW and then dizziness resolved and pt able to progress with an additional 50 feet  Stairs            Wheelchair Mobility     Tilt Bed    Modified Rankin (Stroke Patients  Only)       Balance Overall balance assessment: Mild deficits observed, not formally tested                                           Pertinent Vitals/Pain Pain Assessment Pain Assessment: No/denies pain    Home Living Family/patient expects to be discharged to:: Private residence Living Arrangements: Alone Available  Help at Discharge: Family Type of Home: Apartment Home Access: Stairs to enter Entrance Stairs-Rails: Doctor, general practice of Steps: 3 flights of stairs to access apartment   Home Layout: One level Home Equipment: None      Prior Function Prior Level of Function : Independent/Modified Independent             Mobility Comments: pt reports amb no AD and IND for all ADLs, self care tasks and IADLs       Extremity/Trunk Assessment        Lower Extremity Assessment Lower Extremity Assessment: Generalized weakness    Cervical / Trunk Assessment Cervical / Trunk Assessment: Normal  Communication   Communication Communication: No apparent difficulties    Cognition Arousal: Alert Behavior During Therapy: WFL for tasks assessed/performed   PT - Cognitive impairments: No apparent impairments                         Following commands: Intact       Cueing       General Comments      Exercises     Assessment/Plan    PT Assessment Patient needs continued PT services  PT Problem List Decreased activity tolerance;Decreased balance;Decreased mobility;Decreased coordination       PT Treatment Interventions DME instruction;Gait training;Stair training;Functional mobility training;Therapeutic activities;Therapeutic exercise;Balance training;Neuromuscular re-education;Patient/family education    PT Goals (Current goals can be found in the Care Plan section)  Acute Rehab PT Goals Patient Stated Goal: to be able to have the caterac surgery in July PT Goal Formulation: With patient Time For Goal Achievement: 06/29/24 Potential to Achieve Goals: Good    Frequency Min 3X/week     Co-evaluation               AM-PAC PT 6 Clicks Mobility  Outcome Measure Help needed turning from your back to your side while in a flat bed without using bedrails?: None Help needed moving from lying on your back to sitting on the side of a flat bed  without using bedrails?: A Little Help needed moving to and from a bed to a chair (including a wheelchair)?: A Little Help needed standing up from a chair using your arms (e.g., wheelchair or bedside chair)?: A Little Help needed to walk in hospital room?: A Little Help needed climbing 3-5 steps with a railing? : A Lot 6 Click Score: 18    End of Session Equipment Utilized During Treatment: Gait belt Activity Tolerance: Patient tolerated treatment well Patient left: in bed;with call bell/phone within reach;with bed alarm set Nurse Communication: Mobility status PT Visit Diagnosis: Unsteadiness on feet (R26.81);Other abnormalities of gait and mobility (R26.89);Muscle weakness (generalized) (M62.81);Difficulty in walking, not elsewhere classified (R26.2)    Time: 8399-8380 PT Time Calculation (min) (ACUTE ONLY): 19 min   Charges:   PT Evaluation $PT Eval Low Complexity: 1 Low   PT General Charges $$ ACUTE PT VISIT: 1 Visit  [place], PT Acute Rehab   [place] VEAR Drone 06/15/2024, 5:04 PM

## 2024-06-15 NOTE — Progress Notes (Signed)
   06/15/24 1145  TOC Brief Assessment  Insurance and Status Reviewed  Patient has primary care physician Yes  Home environment has been reviewed Single family home alone  Prior level of function: Independent  Prior/Current Home Services No current home services  Social Drivers of Health Review SDOH reviewed no interventions necessary  Readmission risk has been reviewed Yes  Transition of care needs transition of care needs identified, TOC will continue to follow   PT consulted to evaluate pt. TOC will follow for recommendations.

## 2024-06-15 NOTE — Progress Notes (Addendum)
 PROGRESS NOTE  Danielle Castillo  DOB: 03/13/1956  PCP: System, Provider Not In FMW:[id number]  DOA: 06/13/2024  LOS: 0 days  Hospital Day: 3  Brief narrative: Danielle Castillo is a 68 y.o. female with PMH significant for chronic anemia, asthma, migraine, hypothyroidism.. Patient has long history of hypothyroidism and was on Synthroid.  Around October 2024, she started to have diarrhea, weight loss, progressive weakness and was seen by her PCP.  In the follow-up in November, she had TSH checked which was less than 0.05.  It seems patient was continued on Synthroid.  She continued to have symptoms and continue to lose weight.   Finally on April 30, decision was made to stop Synthroid.   Follow-up on 6/11, patient had persistent symptoms.  Thyrotropin receptors were checked and noted to be elevated to 34 (normal range <1.75.  Patient was offered methimazole but she wanted to see a specialist before starting it.  A referral to endocrine was provided but apparently the appointment is not due till August. 6/16, she was seen by another MD at the PCPs office.  Patient was started on methimazole 10 mg twice daily and atenolol 25 mg daily.  New PCP also communicated with the endocrinologist Dr. Ivery who agreed with the regimen and thought that a follow-up in August was appropriate.  Patient continued to have symptoms at home.  She also started to get dizzy on standing up and hence presented to ED on 6/22.  In the ED, patient was afebrile, hemodynamically stable. On an attempt to get orthostatic blood pressure, patient became tremulous, dizzy and could not stand long enough for blood pressure measurement.  She reportedly had similar issues at home as well Labs showed CBC unremarkable, AST, ALT, alk phos elevated, troponin not elevated. CT angio chest did not show any evidence of pulmonary embolism. Admitted to [facility]  Subjective: Patient was seen and examined this morning. Lying on bed. Chart  reviewed Remains hemodynamically stable  Assessment and plan: Graves' disease Presented with continued dizziness, weight loss and generalized malaise.   Vital signs are unremarkable, no evidence of thyroid storm Free T4 was 5.3 on 4/29, 3.8 on 6/5 and 1.64 this hospitalization.   TSH undetectable.  Thyrotropin receptor was elevated to 34 on 6/11. I had an informal discussion with endocrinologist Dr. Mercie with [facility].  He suggested to get a free T3 level.  If it is not super elevated, tentative plan is to continue the same dose of methimazole and atenolol for now.  Orthostatic hypotension Patient got dizzy and could not stand for blood pressure measurement today. Start IV fluid.  HLD Lipitor   Mobility: PT eval  Goals of care   Code Status: Full Code     DVT prophylaxis:  enoxaparin (LOVENOX) injection 40 mg Start: 06/15/24 1050   Antimicrobials: None Fluid: NS at 75 mL/h Consultants: None Family Communication: None at bedside  Status: Observation  Level of care:  Telemetry   Patient is from: Home Needs to continue in-hospital care: Needs symptoms control Anticipated d/c to: Hopefully home in 1 to 2 days    Diet:  Diet Order             Diet regular Room service appropriate? Yes; Fluid consistency: Thin  Diet effective now                   Scheduled Meds:  atenolol  25 mg Oral Daily   enoxaparin (LOVENOX) injection  40 mg Subcutaneous Daily  feeding supplement  237 mL Oral BID BM   methIMAzole  10 mg Oral BID    PRN meds: acetaminophen **OR** acetaminophen, albuterol, cyclobenzaprine , ondansetron **OR** ondansetron (ZOFRAN) IV, traZODone   Infusions:   sodium chloride      Antimicrobials: Anti-infectives (From admission, onward)    None       Objective: Vitals:   06/15/24 1002 06/15/24 1201  BP: 131/71 139/66  Pulse: 75 65  Resp:  20  Temp:  (!) 97.3 F (36.3 C)  SpO2:  98%    Intake/Output Summary (Last 24 hours) at  06/15/2024 1238 Last data filed at 06/14/2024 1641 Gross per 24 hour  Intake 120 ml  Output --  Net 120 ml   There were no vitals filed for this visit. Weight change:  There is no height or weight on file to calculate BMI.   Physical Exam: General exam: Pleasant, elderly African-American female. jittery Skin: No rashes, lesions or ulcers. HEENT: Atraumatic, normocephalic, no obvious bleeding Lungs: Clear to auscultation bilaterally,  CVS: S1, S2, no murmur,   GI/Abd: Soft, nontender, nondistended, bowel sound present,   CNS: alert, awake, oriented x 3 Psychiatry: Mood appropriate,  Extremities: No pedal edema, no calf tenderness,   Data Review: I have personally reviewed the laboratory data and studies available.  F/u labs ordered Unresulted Labs (From admission, onward)     Start     Ordered   06/15/24 1233  T3, free  Add-on,   AD        06/15/24 1232            Signed, Chapman Rota, MD [facility] 06/15/2024

## 2024-06-15 NOTE — Plan of Care (Signed)

## 2024-06-15 NOTE — Care Management Obs Status (Signed)
 MEDICARE OBSERVATION STATUS NOTIFICATION   Patient Details  Name: Danielle Castillo MRN: [id number] Date of Birth: [birth year]   Medicare Observation Status Notification Given:  Yes    Hoy [name], LCSW 06/15/2024, 11:30 AM

## 2024-06-16 DIAGNOSIS — E440 Moderate protein-calorie malnutrition: Secondary | ICD-10-CM | POA: Insufficient documentation

## 2024-06-16 DIAGNOSIS — R55 Syncope and collapse: Secondary | ICD-10-CM | POA: Diagnosis not present

## 2024-06-16 LAB — T3, FREE: T3, Free: 5.3 pg/mL — ABNORMAL HIGH (ref 2.0–4.4)

## 2024-06-16 MED ORDER — ENSURE PLUS HIGH PROTEIN PO LIQD: 237.0000 mL | Freq: Two times a day (BID) | ORAL | Status: AC

## 2024-06-16 NOTE — Discharge Summary (Addendum)
 Physician Discharge Summary  Dortha Neighbors FMW:[id number] DOB: 01-14-1956 DOA: 06/13/2024  PCP: System, Provider Not In  Admit date: 06/13/2024 Discharge date: 06/16/2024  Admitted From: Home Discharge disposition: Home  Recommendations at discharge:  Continue Methimazole and atenolol.  Encourage oral hydration.  Follow up with PCP and endocrinologist as an outpatient.    Brief narrative: Danielle Castillo is a 68 y.o. female with PMH significant for chronic anemia, asthma, migraine, hypothyroidism.. Patient has long history of hypothyroidism and was on Synthroid.  Around October 2024, she started to have diarrhea, weight loss, progressive weakness and was seen by her PCP.  In the follow-up in November, she had TSH checked which was less than 0.05.  It seems patient was continued on Synthroid.  She continued to have symptoms and continue to lose weight.   Finally on April 30, decision was made to stop Synthroid.   Follow-up on 6/11, patient had persistent symptoms.  Thyrotropin receptors were checked and noted to be elevated to 34 (normal range <1.75.  Patient was offered methimazole but she wanted to see a specialist before starting it.  A referral to endocrine was provided but apparently the appointment is not due till August. 6/16, she was seen by another MD at the PCPs office.  Patient was started on methimazole 10 mg twice daily and atenolol 25 mg daily.  New PCP also communicated with the endocrinologist Dr. Ivery who agreed with the regimen and thought that a follow-up in August was appropriate.  Patient continued to have symptoms at home.  She also started to get dizzy on standing up and hence presented to ED on 6/22.  In the ED, patient was afebrile, hemodynamically stable. On an attempt to get orthostatic blood pressure, patient became tremulous, dizzy and could not stand long enough for blood pressure measurement.  She reportedly had similar issues at home as well Labs showed CBC  unremarkable, AST, ALT, alk phos elevated, troponin not elevated. CT angio chest did not show any evidence of pulmonary embolism. Admitted to [facility]  Subjective: Patient was seen and examined this morning. Lying on bed.  Not in distress.  Was able to ambulate with tolerable dizziness.  Did not drop orthostatics.  Hospital course: Graves' disease Presented with continued dizziness, weight loss and generalized malaise.   Vital signs are unremarkable, no evidence of thyroid storm Free T4 was 5.3 on 4/29, 3.8 on 6/5 and 1.64 this hospitalization.   TSH undetectable.  Free T3 level elevated at 5.3. Thyrotropin receptor was elevated to 34 on 6/11. I had an informal discussion with endocrinologist Dr. Mercie with [facility].  We discussed and analyzed the trend in P3, T4, TSH and the current levels.  Levels are gradually improving.  Patient should continue the current outpatient dose of methimazole 10 mg twice daily and atenolol 25 mg daily.  She already has scheduled endocrinology appointment on 8/8 with [facility] endocrinologist.  She will have thyroid function repeated at that time.  Dizziness Patient got dizzy and could not stand for blood pressure measurement yesterday.  Later, she was able to ambulate with PT with tolerable dizziness and did not drop orthostatics.. Start IV fluid.  HLD Lipitor  Moderate malnutrition Seen by dietitian.  Given recommendations for supplements.   Goals of care   Code Status: Full Code   Diet:  Diet Order             Diet general           Diet regular Room  service appropriate? Yes; Fluid consistency: Thin  Diet effective now                   Nutritional status:  Body mass index is 21.79 kg/m.  Nutrition Problem: Moderate Malnutrition Etiology: chronic illness Signs/Symptoms: mild fat depletion, moderate muscle depletion, percent weight loss (20% in 6 months) Percent weight loss: 20 % (in 6 months)  Wounds:  -    Discharge Exam:    Vitals:   06/16/24 0403 06/16/24 1043 06/16/24 1231 06/16/24 1236  BP: (!) 140/77  (!) 152/80 (!) 144/71  Pulse: 67  67 64  Resp: 16  18 17   Temp: 97.8 F (36.6 C)  97.9 F (36.6 C) 98.1 F (36.7 C)  TempSrc:      SpO2: 98%  99% 99%  Weight:  52.3 kg      Body mass index is 21.79 kg/m.  General exam: Pleasant, elderly African-American female.  Not jittery today. Skin: No rashes, lesions or ulcers. HEENT: Atraumatic, normocephalic, no obvious bleeding Lungs: Clear to auscultation bilaterally,  CVS: S1, S2, no murmur,   GI/Abd: Soft, nontender, nondistended, bowel sound present,   CNS: alert, awake, oriented x 3 Psychiatry: Mood appropriate,  Extremities: No pedal edema, no calf tenderness,   Follow ups:    Follow-up Information     [facility] Follow up.   Specialty: Home Health Services Why: Hedda will follow up with you at discharge to provide home health physical therapy services Contact information: [address] [place] [address] [phone]         [facility] Follow up.   Contact information: [address] [employer] [address] [place]  [address] [phone]                Discharge Instructions:   Discharge Instructions     Call MD for:  difficulty breathing, headache or visual disturbances   Complete by: As directed    Call MD for:  extreme fatigue   Complete by: As directed    Call MD for:  hives   Complete by: As directed    Call MD for:  persistant dizziness or light-headedness   Complete by: As directed    Call MD for:  persistant nausea and vomiting   Complete by: As directed    Call MD for:  severe uncontrolled pain   Complete by: As directed    Call MD for:  temperature >100.4   Complete by: As directed    Diet general   Complete by: As directed    Discharge instructions   Complete by: As directed    Continue Methimazole and atenolol. Encourage oral  hydration. Follow up with PCP and endocrinologist as an outpatient.  General discharge instructions: Follow with Primary MD System, Provider Not In in 7 days  Please request your PCP  to go over your hospital tests, procedures, radiology results at the follow up. Please get your medicines reviewed and adjusted.  Your PCP may decide to repeat certain labs or tests as needed. Do not drive, operate heavy machinery, perform activities at heights, swimming or participation in water activities or provide baby sitting services if your were admitted for syncope or siezures until you have seen by Primary MD or a Neurologist and advised to do so again. [place]  Controlled Substance Reporting System database was reviewed. Do not drive, operate heavy machinery, perform activities at heights, swim, participate in water activities or  provide baby-sitting services while on medications for pain, sleep and mood until your outpatient physician has reevaluated you and advised to do so again.  You are strongly recommended to comply with the dose, frequency and duration of prescribed medications. Activity: As tolerated with Full fall precautions use walker/cane & assistance as needed Avoid using any recreational substances like cigarette, tobacco, alcohol, or non-prescribed drug. If you experience worsening of your admission symptoms, develop shortness of breath, life threatening emergency, suicidal or homicidal thoughts you must seek medical attention immediately by calling 911 or calling your MD immediately  if symptoms less severe. You must read complete instructions/literature along with all the possible adverse reactions/side effects for all the medicines you take and that have been prescribed to you. Take any new medicine only after you have completely understood and accepted all the possible adverse reactions/side effects.  Wear Seat belts while driving. You were cared for by a hospitalist during your hospital  stay. If you have any questions about your discharge medications or the care you received while you were in the hospital after you are discharged, you can call the unit and ask to speak with the hospitalist or the covering physician. Once you are discharged, your primary care physician will handle any further medical issues. Please note that NO REFILLS for any discharge medications will be authorized once you are discharged, as it is imperative that you return to your primary care physician (or establish a relationship with a primary care physician if you do not have one).   Increase activity slowly   Complete by: As directed        Discharge Medications:   Allergies as of 06/16/2024       Reactions   Aspirin Other (See Comments)   Gi upset        Medication List     STOP taking these medications    benzonatate  100 MG capsule Commonly known as: TESSALON    cyclobenzaprine  5 MG tablet Commonly known as: FLEXERIL    erythromycin  ophthalmic ointment   meloxicam  15 MG tablet Commonly known as: Mobic    naproxen  500 MG tablet Commonly known as: NAPROSYN    predniSONE  10 MG tablet Commonly known as: DELTASONE    tobramycin  0.3 % ophthalmic solution Commonly known as: Tobrex        TAKE these medications    albuterol 108 (90 Base) MCG/ACT inhaler Commonly known as: VENTOLIN HFA Inhale 2 puffs into the lungs every 6 (six) hours as needed for wheezing or shortness of breath.   atenolol 25 MG tablet Commonly known as: TENORMIN Take 25 mg by mouth daily.   atorvastatin 20 MG tablet Commonly known as: LIPITOR Take 20 mg by mouth at bedtime.   cetirizine 10 MG tablet Commonly known as: ZYRTEC Take 10 mg by mouth daily.   Dupilumab 300 MG/2ML Soaj Inject 300 mg into the skin every 14 (fourteen) days.   feeding supplement Liqd Take 237 mLs by mouth 2 (two) times daily between meals. Start taking on: June 17, 2024   fluticasone  50 MCG/ACT nasal spray Commonly known  as: FLONASE  Place 1 spray into both nostrils daily.   gabapentin  300 MG capsule Commonly known as: NEURONTIN  Take 300 mg by mouth 3 (three) times daily as needed (for pain). What changed: Another medication with the same name was removed. Continue taking this medication, and follow the directions you see here.   ketorolac 0.5 % ophthalmic solution Commonly known as: ACULAR Place 1 drop into both eyes 4 (four) times  daily.   methimazole 10 MG tablet Commonly known as: TAPAZOLE Take 10 mg by mouth in the morning and at bedtime.               Durable Medical Equipment  (From admission, onward)           Start     Ordered   06/16/24 1101  For home use only DME Walker rolling  Once       Question Answer Comment  Walker: With 5 Inch Wheels   Patient needs a walker to treat with the following condition Impaired mobility      06/16/24 1100             The results of significant diagnostics from this hospitalization (including imaging, microbiology, ancillary and laboratory) are listed below for reference.    Procedures and Diagnostic Studies:   CT Angio Chest PE W and/or Wo Contrast Result Date: 06/13/2024 CLINICAL DATA:  Generalized weakness for several days, initial encounter EXAM: CT ANGIOGRAPHY CHEST WITH CONTRAST TECHNIQUE: Multidetector CT imaging of the chest was performed using the standard protocol during bolus administration of intravenous contrast. Multiplanar CT image reconstructions and MIPs were obtained to evaluate the vascular anatomy. RADIATION DOSE REDUCTION: This exam was performed according to the departmental dose-optimization program which includes automated exposure control, adjustment of the mA and/or kV according to patient size and/or use of iterative reconstruction technique. CONTRAST:  75mL OMNIPAQUE IOHEXOL 350 MG/ML SOLN COMPARISON:  01/28/2022, chest x-ray from earlier in the same day. FINDINGS: Cardiovascular: Atherosclerotic calcifications  of the thoracic aorta are noted. No aneurysmal dilatation or dissection is noted. Heart is at the upper limits of normal in size. The pulmonary artery shows a normal branching pattern bilaterally. No intraluminal filling defect is identified to suggest pulmonary embolism. No significant coronary calcifications are noted. Mediastinum/Nodes: Thoracic inlet is within normal limits. No hilar or mediastinal adenopathy is noted. The esophagus as visualized is within normal limits. Lungs/Pleura: Lungs are well aerated bilaterally. No focal infiltrate or sizable effusion is seen. No sizable parenchymal nodule is noted. Upper Abdomen: No acute abnormality noted. Musculoskeletal: No chest wall abnormality. No acute or significant osseous findings. Review of the MIP images confirms the above findings. IMPRESSION: No evidence of pulmonary emboli. No acute abnormality noted. Electronically Signed   By: Oneil Devonshire M.D.   On: 06/13/2024 21:05   DG Chest 2 View Result Date: 06/13/2024 CLINICAL DATA:  Dizziness with nausea and weakness a few months. Newly diagnosed Graves disease. EXAM: CHEST - 2 VIEW COMPARISON:  02/02/2024 FINDINGS: Lungs are adequately inflated and otherwise clear. Cardiomediastinal silhouette and remainder of the exam is unchanged. IMPRESSION: No active cardiopulmonary disease. Electronically Signed   By: Toribio Agreste M.D.   On: 06/13/2024 13:49     Labs:   Basic Metabolic Panel: Recent Labs  Lab 06/13/24 1257 06/15/24 0522  NA 142 138  K 4.2 3.6  CL 106 104  CO2 25 26  GLUCOSE 94 94  BUN 9 8  CREATININE 0.51 0.48  CALCIUM 10.2 9.5  MG 1.8  --    GFR Estimated Creatinine Clearance: 50.8 mL/min (by C-G formula based on SCr of 0.48 mg/dL). Liver Function Tests: Recent Labs  Lab 06/13/24 1257  AST 57*  ALT 47*  ALKPHOS 186*  BILITOT 0.3  PROT 7.4  ALBUMIN 4.1   Recent Labs  Lab 06/13/24 1257  LIPASE 54*   No results for input(s): AMMONIA in the last 168  hours. Coagulation profile  No results for input(s): INR, PROTIME in the last 168 hours.  CBC: Recent Labs  Lab 06/13/24 1257 06/15/24 0522  WBC 5.2 4.2  NEUTROABS 2.2  --   HGB 12.2 11.5*  HCT 38.7 36.8  MCV 87.2 88.7  PLT 178 157   Cardiac Enzymes: No results for input(s): CKTOTAL, CKMB, CKMBINDEX, TROPONINI in the last 168 hours. BNP: Invalid input(s): POCBNP CBG: No results for input(s): GLUCAP in the last 168 hours. D-Dimer No results for input(s): DDIMER in the last 72 hours. Hgb A1c No results for input(s): HGBA1C in the last 72 hours. Lipid Profile No results for input(s): CHOL, HDL, LDLCALC, TRIG, CHOLHDL, LDLDIRECT in the last 72 hours. Thyroid function studies Recent Labs    06/15/24 1307  T3FREE 5.3*   Anemia work up No results for input(s): VITAMINB12, FOLATE, FERRITIN, TIBC, IRON, RETICCTPCT in the last 72 hours. Microbiology Recent Results (from the past 240 hours)  Resp panel by RT-PCR (RSV, Flu A&B, Covid) Anterior Nasal Swab     Status: None   Collection Time: 06/13/24 12:57 PM   Specimen: Anterior Nasal Swab  Result Value Ref Range Status   SARS Coronavirus 2 by RT PCR NEGATIVE NEGATIVE Final    Comment: (NOTE) SARS-CoV-2 target nucleic acids are NOT DETECTED.  The SARS-CoV-2 RNA is generally detectable in upper respiratory specimens during the acute phase of infection. The lowest concentration of SARS-CoV-2 viral copies this assay can detect is 138 copies/mL. A negative result does not preclude SARS-Cov-2 infection and should not be used as the sole basis for treatment or other patient management decisions. A negative result may occur with  improper specimen collection/handling, submission of specimen other than nasopharyngeal swab, presence of viral mutation(s) within the areas targeted by this assay, and inadequate number of viral copies(<138 copies/mL). A negative result must be combined  with clinical observations, patient history, and epidemiological information. The expected result is Negative.  Fact Sheet for Patients:  BloggerCourse.com  Fact Sheet for Healthcare Providers:  SeriousBroker.it  This test is no t yet approved or cleared by the [place]  FDA and  has been authorized for detection and/or diagnosis of SARS-CoV-2 by FDA under an Emergency Use Authorization (EUA). This EUA will remain  in effect (meaning this test can be used) for the duration of the COVID-19 declaration under Section 564(b)(1) of the Act, 21 U.S.C.section 360bbb-3(b)(1), unless the authorization is terminated  or revoked sooner.       Influenza A by PCR NEGATIVE NEGATIVE Final   Influenza B by PCR NEGATIVE NEGATIVE Final    Comment: (NOTE) The Xpert Xpress SARS-CoV-2/FLU/RSV plus assay is intended as an aid in the diagnosis of influenza from Nasopharyngeal swab specimens and should not be used as a sole basis for treatment. Nasal washings and aspirates are unacceptable for Xpert Xpress SARS-CoV-2/FLU/RSV testing.  Fact Sheet for Patients: BloggerCourse.com  Fact Sheet for Healthcare Providers: SeriousBroker.it  This test is not yet approved or cleared by the [place]  FDA and has been authorized for detection and/or diagnosis of SARS-CoV-2 by FDA under an Emergency Use Authorization (EUA). This EUA will remain in effect (meaning this test can be used) for the duration of the COVID-19 declaration under Section 564(b)(1) of the Act, 21 U.S.C. section 360bbb-3(b)(1), unless the authorization is terminated or revoked.     Resp Syncytial Virus by PCR NEGATIVE NEGATIVE Final    Comment: (NOTE) Fact Sheet for Patients: BloggerCourse.com  Fact Sheet for Healthcare Providers: SeriousBroker.it  This test is  not yet approved  or cleared by the [place]  FDA and has been authorized for detection and/or diagnosis of SARS-CoV-2 by FDA under an Emergency Use Authorization (EUA). This EUA will remain in effect (meaning this test can be used) for the duration of the COVID-19 declaration under Section 564(b)(1) of the Act, 21 U.S.C. section 360bbb-3(b)(1), unless the authorization is terminated or revoked.  Performed at [facility], [address], [place], [place] [address]     Time coordinating discharge: 45 minutes  Signed: Chapman Rota  [facility] 06/16/2024, 2:22 PM

## 2024-06-16 NOTE — Progress Notes (Signed)
 Initial Nutrition Assessment  DOCUMENTATION CODES:   Non-severe (moderate) malnutrition in context of chronic illness  INTERVENTION:  - Regular diet.  - Ensure Plus High Protein po BID, each supplement provides 350 kcal and 20 grams of protein. - Monitor weight trends.   NUTRITION DIAGNOSIS:   Moderate Malnutrition related to chronic illness as evidenced by mild fat depletion, moderate muscle depletion, percent weight loss (20% in 6 months).  GOAL:   Patient will meet greater than or equal to 90% of their needs  MONITOR:   PO intake, Supplement acceptance, Weight trends  REASON FOR ASSESSMENT:   Consult Assessment of nutrition requirement/status, Other (Comment) (Wt loss from hyperthyroidism, chronic diarrhea. Complains of tingling sensations)  ASSESSMENT:   68 y.o. female with PMH significant for chronic anemia, asthma, migraine, hypothyroidism on synthroid. Around October 2024, she started to have diarrhea, weight loss, progressive weakness. In November, she had TSH checked which was less than 0.05 but it seems patient was continued on Synthroid. On April 30, decision was made to stop Synthroid. Follow-up on 6/11, patient had persistent symptoms. 6/16, she was started on methimazole and atenolol but patient has had continued symptoms and started to get dizzy with standing so presented to ED. Admitted for Grave's disease.    Patient reports a UBW of 160# and weight loss since December.  Per chart review of office weights, patient stable from 140-143# from June 2025-December 2025. Patient weighed at 142# in December and has since continued to drop to most recent office weight of 113# on 6/6. This is a 29# or 20% weight loss in 6 months, which is significant and severe for the time frame.   Patient notes her appetite was poor beginning in December until about the middle of April. Notes she was still trying to eat well during that time because she knew she needed to. Since the  middle of April, her appetite seemed to improve and she began eating better. She typically eats 2-3 meals a day. Was previously drinking Boost Plus at home but not recently.   Current appetite is great and patient endorses eating 100% of all meals. EMR confirms patient eating 100%. She is also drinking Ensure each time it is provided.   Patient to discharge home today. Encouraged continued intake of 3 meals a day and nutrition supplements at home as well.   Medications reviewed and include: -  Labs reviewed:  No BMP today   NUTRITION - FOCUSED PHYSICAL EXAM:  Flowsheet Row Most Recent Value  Orbital Region Mild depletion  Upper Arm Region No depletion  Thoracic and Lumbar Region Unable to assess  Buccal Region Mild depletion  Temple Region Mild depletion  Clavicle Bone Region Moderate depletion  Clavicle and Acromion Bone Region Moderate depletion  Scapular Bone Region Unable to assess  Dorsal Hand Mild depletion  Patellar Region Mild depletion  Anterior Thigh Region Mild depletion  Posterior Calf Region Mild depletion  Edema (RD Assessment) None  Hair Reviewed  Eyes Reviewed  Mouth Reviewed  Skin Reviewed  Nails Reviewed    Diet Order:   Diet Order             Diet general           Diet regular Room service appropriate? Yes; Fluid consistency: Thin  Diet effective now                   EDUCATION NEEDS:  Education needs have been addressed  Skin:  Skin Assessment: Reviewed  RN Assessment  Last BM:  6/24  Height:  Ht Readings from Last 1 Encounters:  02/02/24 5' 1 (1.549 m)   Weight:  Wt Readings from Last 1 Encounters:  06/16/24 52.3 kg    BMI:  Body mass index is 21.79 kg/m.  Estimated Nutritional Needs:  Kcal:  1550-1850 kcals Protein:  75-85 grams Fluid:  >/= 1.6L    Trude Ned RD, LDN Contact via Secure Chat.

## 2024-06-16 NOTE — TOC Progression Note (Addendum)
 Transition of Care [facility]) - Progression Note    Patient Details  Name: Danielle Castillo MRN: [id number] Date of Birth: 08/17/1956  [facility] CM/SW Contact  Hoy [name], LCSW Phone Number: 06/16/2024, 11:05 AM  Clinical Narrative:    Met with pt and confirmed plan to return home with home health services at discharge. Pt denies having HH in the past and does not have agency preference. Pt wants to ensure [facility] is in-network with her insurance. HHPT has been arranged with [facility]. HH order in place. Pt also recommended for RW. Pt denies having any AD at home currently and is agreeable to have RW ordered. RW has been ordered through [facility] to be delivered to pt's room prior to discharge.   ADDENDUM: Per [employer] pt has a $300 deductible and would have to pay $46.60 for RW. Pt unable to afford this. Unable to obtain RW for pt at this time.     Expected Discharge Plan: Home w Home Health Services Barriers to Discharge: No Barriers Identified  Expected Discharge Plan and Services In-house Referral: Clinical Social Work Discharge Planning Services: NA Post Acute Care Choice: Durable Medical Equipment, Home Health Living arrangements for the past 2 months: Single Family Home                 DME Arranged: Walker rolling DME Agency: [facility] Date DME Agency Contacted: 06/16/24 Time DME Agency Contacted: 1105 Representative spoke with at DME Agency: Zack HH Arranged: PT HH Agency: [facility] Date [facility] Agency Contacted: 06/16/24 Time HH Agency Contacted: 1105 Representative spoke with at [facility] Agency: Cindie   Social Determinants of Health (SDOH) Interventions SDOH Screenings   Food Insecurity: No Food Insecurity (06/14/2024)  Recent Concern: Food Insecurity - Medium Risk (06/02/2024)   Received from [facility]  Housing: Low Risk  (06/14/2024)  Transportation Needs: No Transportation Needs (06/14/2024)  Utilities: Not At Risk (06/14/2024)  Financial  Resource Strain: High Risk (03/14/2020)   Received from [facility] visits prior to 02/22/2023.  Physical Activity: Inactive (03/14/2020)   Received from [facility] visits prior to 02/22/2023.  Social Connections: Socially Isolated (06/14/2024)  Stress: Stress Concern Present (03/14/2020)   Received from [facility] visits prior to 02/22/2023.  Tobacco Use: Unknown (06/13/2024)  Recent Concern: Tobacco Use - Medium Risk (06/07/2024)   Received from [facility]    Readmission Risk Interventions     No data to display
